# Patient Record
Sex: Male | Born: 1940 | Race: White | Hispanic: No | Marital: Single | Smoking: Never smoker
Health system: Southern US, Community
[De-identification: ages and names within clinical notes are randomized; demographics above are authoritative.]

## PROBLEM LIST (undated history)

## (undated) DIAGNOSIS — E119 Type 2 diabetes mellitus without complications: Secondary | ICD-10-CM

## (undated) DIAGNOSIS — E1140 Type 2 diabetes mellitus with diabetic neuropathy, unspecified: Secondary | ICD-10-CM

## (undated) DIAGNOSIS — Z789 Other specified health status: Secondary | ICD-10-CM

## (undated) DIAGNOSIS — I739 Peripheral vascular disease, unspecified: Secondary | ICD-10-CM

## (undated) DIAGNOSIS — M14672 Charcot's joint, left ankle and foot: Secondary | ICD-10-CM

## (undated) HISTORY — DX: Type 2 diabetes mellitus without complications: E11.9

## (undated) HISTORY — DX: Other specified health status: Z78.9

## (undated) HISTORY — DX: Charcot's joint, left ankle and foot: M14.672

## (undated) HISTORY — DX: Peripheral vascular disease, unspecified: I73.9

## (undated) HISTORY — DX: Type 2 diabetes mellitus with diabetic neuropathy, unspecified: E11.40

---

## 2016-04-01 ENCOUNTER — Encounter: Payer: Self-pay | Admitting: Podiatry

## 2016-04-01 ENCOUNTER — Ambulatory Visit (INDEPENDENT_AMBULATORY_CARE_PROVIDER_SITE_OTHER): Payer: Medicare Other | Admitting: Podiatry

## 2016-04-01 VITALS — BP 88/45 | HR 79 | Resp 14

## 2016-04-01 DIAGNOSIS — L97501 Non-pressure chronic ulcer of other part of unspecified foot limited to breakdown of skin: Secondary | ICD-10-CM

## 2016-04-01 DIAGNOSIS — L89891 Pressure ulcer of other site, stage 1: Secondary | ICD-10-CM | POA: Diagnosis not present

## 2016-04-01 DIAGNOSIS — E1151 Type 2 diabetes mellitus with diabetic peripheral angiopathy without gangrene: Secondary | ICD-10-CM

## 2016-04-01 DIAGNOSIS — M79676 Pain in unspecified toe(s): Secondary | ICD-10-CM | POA: Diagnosis not present

## 2016-04-01 DIAGNOSIS — B351 Tinea unguium: Secondary | ICD-10-CM | POA: Diagnosis not present
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
## 2016-05-13 DIAGNOSIS — E11621 Type 2 diabetes mellitus with foot ulcer: Secondary | ICD-10-CM | POA: Diagnosis not present

## 2016-05-13 DIAGNOSIS — I5030 Unspecified diastolic (congestive) heart failure: Secondary | ICD-10-CM | POA: Diagnosis not present

## 2016-05-13 DIAGNOSIS — L97511 Non-pressure chronic ulcer of other part of right foot limited to breakdown of skin: Secondary | ICD-10-CM | POA: Diagnosis not present

## 2016-05-13 DIAGNOSIS — L97521 Non-pressure chronic ulcer of other part of left foot limited to breakdown of skin: Secondary | ICD-10-CM | POA: Diagnosis not present

## 2016-05-16 DIAGNOSIS — E11621 Type 2 diabetes mellitus with foot ulcer: Secondary | ICD-10-CM | POA: Diagnosis not present

## 2016-05-16 DIAGNOSIS — L97521 Non-pressure chronic ulcer of other part of left foot limited to breakdown of skin: Secondary | ICD-10-CM | POA: Diagnosis not present

## 2016-05-16 DIAGNOSIS — L97511 Non-pressure chronic ulcer of other part of right foot limited to breakdown of skin: Secondary | ICD-10-CM | POA: Diagnosis not present

## 2016-05-16 DIAGNOSIS — L97421 Non-pressure chronic ulcer of left heel and midfoot limited to breakdown of skin: Secondary | ICD-10-CM | POA: Diagnosis not present

## 2016-05-16 DIAGNOSIS — I5030 Unspecified diastolic (congestive) heart failure: Secondary | ICD-10-CM | POA: Diagnosis not present

## 2016-05-23 DIAGNOSIS — L97521 Non-pressure chronic ulcer of other part of left foot limited to breakdown of skin: Secondary | ICD-10-CM | POA: Diagnosis not present

## 2016-05-23 DIAGNOSIS — L97421 Non-pressure chronic ulcer of left heel and midfoot limited to breakdown of skin: Secondary | ICD-10-CM | POA: Diagnosis not present

## 2016-05-23 DIAGNOSIS — E11621 Type 2 diabetes mellitus with foot ulcer: Secondary | ICD-10-CM | POA: Diagnosis not present

## 2016-05-23 DIAGNOSIS — L97512 Non-pressure chronic ulcer of other part of right foot with fat layer exposed: Secondary | ICD-10-CM | POA: Diagnosis not present

## 2016-05-23 DIAGNOSIS — I5030 Unspecified diastolic (congestive) heart failure: Secondary | ICD-10-CM | POA: Diagnosis not present

## 2016-05-30 DIAGNOSIS — E11621 Type 2 diabetes mellitus with foot ulcer: Secondary | ICD-10-CM | POA: Diagnosis not present

## 2016-05-30 DIAGNOSIS — L97512 Non-pressure chronic ulcer of other part of right foot with fat layer exposed: Secondary | ICD-10-CM | POA: Diagnosis not present

## 2016-05-30 DIAGNOSIS — L97521 Non-pressure chronic ulcer of other part of left foot limited to breakdown of skin: Secondary | ICD-10-CM | POA: Diagnosis not present

## 2016-05-30 DIAGNOSIS — I5030 Unspecified diastolic (congestive) heart failure: Secondary | ICD-10-CM | POA: Diagnosis not present

## 2016-06-03 DIAGNOSIS — L97512 Non-pressure chronic ulcer of other part of right foot with fat layer exposed: Secondary | ICD-10-CM | POA: Diagnosis not present

## 2016-06-03 DIAGNOSIS — I5030 Unspecified diastolic (congestive) heart failure: Secondary | ICD-10-CM | POA: Diagnosis not present

## 2016-06-03 DIAGNOSIS — L97521 Non-pressure chronic ulcer of other part of left foot limited to breakdown of skin: Secondary | ICD-10-CM | POA: Diagnosis not present

## 2016-06-03 DIAGNOSIS — E11621 Type 2 diabetes mellitus with foot ulcer: Secondary | ICD-10-CM | POA: Diagnosis not present

## 2016-06-07 DIAGNOSIS — I5030 Unspecified diastolic (congestive) heart failure: Secondary | ICD-10-CM | POA: Diagnosis not present

## 2016-06-07 DIAGNOSIS — L97521 Non-pressure chronic ulcer of other part of left foot limited to breakdown of skin: Secondary | ICD-10-CM | POA: Diagnosis not present

## 2016-06-07 DIAGNOSIS — L97512 Non-pressure chronic ulcer of other part of right foot with fat layer exposed: Secondary | ICD-10-CM | POA: Diagnosis not present

## 2016-06-07 DIAGNOSIS — E11621 Type 2 diabetes mellitus with foot ulcer: Secondary | ICD-10-CM | POA: Diagnosis not present

## 2016-06-12 DIAGNOSIS — L97521 Non-pressure chronic ulcer of other part of left foot limited to breakdown of skin: Secondary | ICD-10-CM | POA: Diagnosis not present

## 2016-06-12 DIAGNOSIS — E11621 Type 2 diabetes mellitus with foot ulcer: Secondary | ICD-10-CM | POA: Diagnosis not present

## 2016-06-19 DIAGNOSIS — L97521 Non-pressure chronic ulcer of other part of left foot limited to breakdown of skin: Secondary | ICD-10-CM | POA: Diagnosis not present

## 2016-06-19 DIAGNOSIS — E11621 Type 2 diabetes mellitus with foot ulcer: Secondary | ICD-10-CM | POA: Diagnosis not present

## 2016-06-19 DIAGNOSIS — L97421 Non-pressure chronic ulcer of left heel and midfoot limited to breakdown of skin: Secondary | ICD-10-CM | POA: Diagnosis not present

## 2016-06-26 DIAGNOSIS — Z09 Encounter for follow-up examination after completed treatment for conditions other than malignant neoplasm: Secondary | ICD-10-CM | POA: Diagnosis not present

## 2016-06-26 DIAGNOSIS — L97429 Non-pressure chronic ulcer of left heel and midfoot with unspecified severity: Secondary | ICD-10-CM | POA: Diagnosis not present

## 2016-06-26 DIAGNOSIS — Z8631 Personal history of diabetic foot ulcer: Secondary | ICD-10-CM | POA: Diagnosis not present

## 2016-06-26 DIAGNOSIS — E11621 Type 2 diabetes mellitus with foot ulcer: Secondary | ICD-10-CM | POA: Diagnosis not present

## 2016-07-10 DIAGNOSIS — E785 Hyperlipidemia, unspecified: Secondary | ICD-10-CM | POA: Diagnosis not present

## 2016-07-10 DIAGNOSIS — I10 Essential (primary) hypertension: Secondary | ICD-10-CM | POA: Diagnosis not present

## 2016-07-10 DIAGNOSIS — M14672 Charcot's joint, left ankle and foot: Secondary | ICD-10-CM | POA: Diagnosis not present

## 2016-07-10 DIAGNOSIS — E1165 Type 2 diabetes mellitus with hyperglycemia: Secondary | ICD-10-CM | POA: Diagnosis not present

## 2016-07-31 DIAGNOSIS — M14672 Charcot's joint, left ankle and foot: Secondary | ICD-10-CM | POA: Diagnosis not present

## 2016-07-31 DIAGNOSIS — R0602 Shortness of breath: Secondary | ICD-10-CM | POA: Diagnosis not present

## 2016-07-31 DIAGNOSIS — Z794 Long term (current) use of insulin: Secondary | ICD-10-CM | POA: Diagnosis not present

## 2016-07-31 DIAGNOSIS — K219 Gastro-esophageal reflux disease without esophagitis: Secondary | ICD-10-CM | POA: Diagnosis not present

## 2016-07-31 DIAGNOSIS — I5023 Acute on chronic systolic (congestive) heart failure: Secondary | ICD-10-CM | POA: Diagnosis not present

## 2016-07-31 DIAGNOSIS — F419 Anxiety disorder, unspecified: Secondary | ICD-10-CM | POA: Diagnosis not present

## 2016-07-31 DIAGNOSIS — R069 Unspecified abnormalities of breathing: Secondary | ICD-10-CM | POA: Diagnosis not present

## 2016-07-31 DIAGNOSIS — Z79899 Other long term (current) drug therapy: Secondary | ICD-10-CM | POA: Diagnosis not present

## 2016-07-31 DIAGNOSIS — E1165 Type 2 diabetes mellitus with hyperglycemia: Secondary | ICD-10-CM | POA: Diagnosis not present

## 2016-07-31 DIAGNOSIS — I472 Ventricular tachycardia: Secondary | ICD-10-CM | POA: Diagnosis not present

## 2016-07-31 DIAGNOSIS — M7989 Other specified soft tissue disorders: Secondary | ICD-10-CM | POA: Diagnosis not present

## 2016-07-31 DIAGNOSIS — R079 Chest pain, unspecified: Secondary | ICD-10-CM | POA: Diagnosis not present

## 2016-07-31 DIAGNOSIS — R0902 Hypoxemia: Secondary | ICD-10-CM | POA: Diagnosis not present

## 2016-07-31 DIAGNOSIS — N400 Enlarged prostate without lower urinary tract symptoms: Secondary | ICD-10-CM | POA: Diagnosis not present

## 2016-07-31 DIAGNOSIS — M14679 Charcot's joint, unspecified ankle and foot: Secondary | ICD-10-CM | POA: Diagnosis not present

## 2016-07-31 DIAGNOSIS — Z888 Allergy status to other drugs, medicaments and biological substances status: Secondary | ICD-10-CM | POA: Diagnosis not present

## 2016-07-31 DIAGNOSIS — E1140 Type 2 diabetes mellitus with diabetic neuropathy, unspecified: Secondary | ICD-10-CM | POA: Diagnosis not present

## 2016-07-31 DIAGNOSIS — Z87891 Personal history of nicotine dependence: Secondary | ICD-10-CM | POA: Diagnosis not present

## 2016-08-01 DIAGNOSIS — R0902 Hypoxemia: Secondary | ICD-10-CM | POA: Diagnosis not present

## 2016-08-01 DIAGNOSIS — M14679 Charcot's joint, unspecified ankle and foot: Secondary | ICD-10-CM | POA: Diagnosis not present

## 2016-08-01 DIAGNOSIS — I5023 Acute on chronic systolic (congestive) heart failure: Secondary | ICD-10-CM | POA: Diagnosis not present

## 2016-08-02 DIAGNOSIS — I5023 Acute on chronic systolic (congestive) heart failure: Secondary | ICD-10-CM | POA: Diagnosis not present

## 2016-08-02 DIAGNOSIS — M14679 Charcot's joint, unspecified ankle and foot: Secondary | ICD-10-CM | POA: Diagnosis not present

## 2016-08-02 DIAGNOSIS — R0902 Hypoxemia: Secondary | ICD-10-CM | POA: Diagnosis not present

## 2016-08-03 DIAGNOSIS — R0902 Hypoxemia: Secondary | ICD-10-CM | POA: Diagnosis not present

## 2016-08-03 DIAGNOSIS — M14679 Charcot's joint, unspecified ankle and foot: Secondary | ICD-10-CM | POA: Diagnosis not present

## 2016-08-03 DIAGNOSIS — I5023 Acute on chronic systolic (congestive) heart failure: Secondary | ICD-10-CM | POA: Diagnosis not present

## 2016-08-06 DIAGNOSIS — Z7982 Long term (current) use of aspirin: Secondary | ICD-10-CM | POA: Diagnosis not present

## 2016-08-06 DIAGNOSIS — I110 Hypertensive heart disease with heart failure: Secondary | ICD-10-CM | POA: Diagnosis not present

## 2016-08-06 DIAGNOSIS — E1165 Type 2 diabetes mellitus with hyperglycemia: Secondary | ICD-10-CM | POA: Diagnosis not present

## 2016-08-06 DIAGNOSIS — G8929 Other chronic pain: Secondary | ICD-10-CM | POA: Diagnosis not present

## 2016-08-06 DIAGNOSIS — Z9181 History of falling: Secondary | ICD-10-CM | POA: Diagnosis not present

## 2016-08-06 DIAGNOSIS — Z87891 Personal history of nicotine dependence: Secondary | ICD-10-CM | POA: Diagnosis not present

## 2016-08-06 DIAGNOSIS — E1140 Type 2 diabetes mellitus with diabetic neuropathy, unspecified: Secondary | ICD-10-CM | POA: Diagnosis not present

## 2016-08-06 DIAGNOSIS — E11610 Type 2 diabetes mellitus with diabetic neuropathic arthropathy: Secondary | ICD-10-CM | POA: Diagnosis not present

## 2016-08-06 DIAGNOSIS — F419 Anxiety disorder, unspecified: Secondary | ICD-10-CM | POA: Diagnosis not present

## 2016-08-06 DIAGNOSIS — I5023 Acute on chronic systolic (congestive) heart failure: Secondary | ICD-10-CM | POA: Diagnosis not present

## 2016-08-07 ENCOUNTER — Encounter: Payer: Self-pay | Admitting: Sports Medicine

## 2016-08-07 ENCOUNTER — Ambulatory Visit (INDEPENDENT_AMBULATORY_CARE_PROVIDER_SITE_OTHER): Payer: PPO | Admitting: Sports Medicine

## 2016-08-07 ENCOUNTER — Encounter (INDEPENDENT_AMBULATORY_CARE_PROVIDER_SITE_OTHER): Payer: Self-pay

## 2016-08-07 ENCOUNTER — Telehealth: Payer: Self-pay | Admitting: *Deleted

## 2016-08-07 DIAGNOSIS — R05 Cough: Secondary | ICD-10-CM | POA: Diagnosis not present

## 2016-08-07 DIAGNOSIS — E1151 Type 2 diabetes mellitus with diabetic peripheral angiopathy without gangrene: Secondary | ICD-10-CM

## 2016-08-07 DIAGNOSIS — M79676 Pain in unspecified toe(s): Secondary | ICD-10-CM

## 2016-08-07 DIAGNOSIS — I517 Cardiomegaly: Secondary | ICD-10-CM | POA: Diagnosis not present

## 2016-08-07 DIAGNOSIS — E1165 Type 2 diabetes mellitus with hyperglycemia: Secondary | ICD-10-CM | POA: Diagnosis not present

## 2016-08-07 DIAGNOSIS — R0989 Other specified symptoms and signs involving the circulatory and respiratory systems: Secondary | ICD-10-CM

## 2016-08-07 DIAGNOSIS — I700 Atherosclerosis of aorta: Secondary | ICD-10-CM | POA: Diagnosis not present

## 2016-08-07 DIAGNOSIS — M79672 Pain in left foot: Secondary | ICD-10-CM | POA: Diagnosis not present

## 2016-08-07 DIAGNOSIS — R0602 Shortness of breath: Secondary | ICD-10-CM | POA: Diagnosis not present

## 2016-08-07 DIAGNOSIS — M14672 Charcot's joint, left ankle and foot: Secondary | ICD-10-CM | POA: Diagnosis not present

## 2016-08-07 DIAGNOSIS — J90 Pleural effusion, not elsewhere classified: Secondary | ICD-10-CM | POA: Diagnosis not present

## 2016-08-07 DIAGNOSIS — T07XXXA Unspecified multiple injuries, initial encounter: Secondary | ICD-10-CM

## 2016-08-07 DIAGNOSIS — I5040 Unspecified combined systolic (congestive) and diastolic (congestive) heart failure: Secondary | ICD-10-CM | POA: Diagnosis not present

## 2016-08-07 DIAGNOSIS — M79671 Pain in right foot: Secondary | ICD-10-CM

## 2016-08-07 DIAGNOSIS — T148 Other injury of unspecified body region: Secondary | ICD-10-CM

## 2016-08-07 DIAGNOSIS — E1140 Type 2 diabetes mellitus with diabetic neuropathy, unspecified: Secondary | ICD-10-CM

## 2016-08-07 DIAGNOSIS — B351 Tinea unguium: Secondary | ICD-10-CM

## 2016-08-07 NOTE — Telephone Encounter (Addendum)
-----   Message from Devin Islamitorya Merritt, [place] sent at 08/07/2016  1:39 PM EDT ----- Regarding: Vascular consults ABIs/PVRs with consult to vascular surgeon Left charcot with multiple abrasions to toes with pain Non palpable pulses DM with angiopathy and neuropathy Dr. Marylene Merritt. I spoke with Devin Merritt and she said it would be fine to have pt [facility].  [facility] lab and referral scheduling.

## 2016-08-07 NOTE — Progress Notes (Signed)
Subjective: Devin Merritt. is a 75 y.o. male patient with history of diabetes who presents to office today complaining of long, painful nails  while in shoes; unable to trim. Patient's niece states that he is a "bad diabetic". Admits history of previous ulceration and pain in toes especially on left foot where he has Charcot and leg deformity. Patient denies any new changes in medication or new problems. Patient denies any new cramping, numbness, burning or tingling in the legs.  Patient is assisted by niece at this visit.   There are no active problems to display for this patient.  Current Outpatient Prescriptions on File Prior to Visit  Medication Sig Dispense Refill  . acetaminophen (TYLENOL) 325 MG tablet Take 650 mg by mouth every 6 (six) hours as needed.    Marland Kitchen aspirin 81 MG tablet Take 81 mg by mouth daily.    Marland Kitchen atorvastatin (LIPITOR) 10 MG tablet Take 10 mg by mouth daily.    . carvedilol (COREG) 12.5 MG tablet Take 12.5 mg by mouth 2 (two) times daily with a meal.    . fluticasone (FLONASE) 50 MCG/ACT nasal spray Place 2 sprays into both nostrils daily.    . furosemide (LASIX) 20 MG tablet Take 20 mg by mouth daily.    Marland Kitchen HYDROcodone-acetaminophen (NORCO) 5-325 MG tablet Take 1 tablet by mouth every 6 (six) hours as needed for moderate pain.    . Insulin Glargine (LANTUS SOLOSTAR) 100 UNIT/ML Solostar Pen Inject into the skin daily at 10 pm. Inject 5units subcutaneously daily at bedtime    . levofloxacin (LEVAQUIN) 750 MG tablet Take 750 mg by mouth daily.    Marland Kitchen lisinopril (PRINIVIL,ZESTRIL) 2.5 MG tablet Take 2.5 mg by mouth daily.    Marland Kitchen omeprazole (PRILOSEC) 20 MG capsule Take 20 mg by mouth daily.    . ondansetron (ZOFRAN) 4 MG tablet Take 4 mg by mouth every 8 (eight) hours as needed for nausea or vomiting.    . polyethylene glycol powder (MIRALAX) powder Take 1 Container by mouth daily.    . Prenatal Vit-Fe Fumarate-FA (PRENATAL VITAMIN PO) Take by mouth daily.    Marland Kitchen senna  (SENOKOT) 8.6 MG tablet Take 1 tablet by mouth 2 (two) times daily.    . simvastatin (ZOCOR) 20 MG tablet Take 20 mg by mouth daily.    Marland Kitchen spironolactone (ALDACTONE) 25 MG tablet Take 25 mg by mouth 2 (two) times daily.    . tamsulosin (FLOMAX) 0.4 MG CAPS capsule Take 0.4 mg by mouth daily.    Marland Kitchen thiamine (VITAMIN B-1) 100 MG tablet Take 100 mg by mouth daily.     No current facility-administered medications on file prior to visit.    Allergies  Allergen Reactions  . Sulfa Antibiotics Other (See Comments)    No results found for this or any previous visit (from the past 2160 hour(s)).  Objective: General: Patient is awake, alert, and oriented x 3 and in no acute distress in wheelchair.  Integument: Skin is warm, dry and supple bilateral. Nails are tender, long, thickened and dystrophic with subungual debris, consistent with onychomycosis, 1-5 bilateral. Multiple superficial abrasions to toes and dry blood blisters with No signs of infection. No open lesions. Remaining integument unremarkable.  Vasculature:  Dorsalis Pedis pulse 0/4 bilateral. Posterior Tibial pulse  0/4 bilateral. Capillary fill time <5 sec 1-5 bilateral. No hair growth to the level of the digits.Temperature gradient decreased. Mild varicosities present bilateral. Trace edema present bilateral. Shiny taut appearance to skin on  left foot with dependent rubor.   Neurology: The patient has absent sensation measured with a 5.07/10g Semmes Weinstein Monofilament at all pedal sites bilateral . Vibratory sensation absent bilateral with tuning fork. No Babinski sign present bilateral.   Musculoskeletal: Left fixed equniovarus charcot ankle on left, Muscular strength 4/5 in all lower extremity muscular groups bilateral without pain on range of motion . No tenderness with calf compression bilateral.  Assessment and Plan: Problem List Items Addressed This Visit    None    Visit Diagnoses    Diabetes mellitus with peripheral  angiopathy (HCC)    -  Primary   Pain due to onychomycosis of toenail       Charcot ankle, left       Foot pain, bilateral       Abrasions of multiple sites       Type 2 diabetes mellitus with diabetic neuropathy, unspecified long term insulin use status (HCC)          -Examined patient. -Discussed and educated patient on diabetic foot care, especially with  regards to the vascular, neurological and musculoskeletal systems.  -Stressed the importance of good glycemic control and the detriment of not controlling glucose levels in relation to the foot. -Mechanically debrided all nails 1-5 bilateral using sterile nail nipper and filed with dremel without incident  -Vascular consult ordered -Answered all patient questions -Patient to return  in 3 months for at risk foot care. Meanwhile will call patient with results of vascular tests are concerning to make sure vascular surgery follow-up is in place -Patient advised to call the office if any problems or questions arise in the meantime.  Asencion Islamitorya Maham Quintin, DPM

## 2016-08-14 DIAGNOSIS — R0602 Shortness of breath: Secondary | ICD-10-CM | POA: Diagnosis not present

## 2016-08-14 DIAGNOSIS — R799 Abnormal finding of blood chemistry, unspecified: Secondary | ICD-10-CM | POA: Diagnosis not present

## 2016-08-14 DIAGNOSIS — E1165 Type 2 diabetes mellitus with hyperglycemia: Secondary | ICD-10-CM | POA: Diagnosis not present

## 2016-08-19 ENCOUNTER — Telehealth: Payer: Self-pay | Admitting: Cardiovascular Disease

## 2016-08-19 NOTE — Telephone Encounter (Signed)
Received records from [facility] for appointment on 08/21/16 with Dr Allyson SabalBerry.  [facility] for Dr Hazle CocaBerry's schedule on 08/21/16. lp

## 2016-08-21 ENCOUNTER — Ambulatory Visit: Payer: Self-pay | Admitting: Cardiovascular Disease

## 2016-08-21 DIAGNOSIS — R918 Other nonspecific abnormal finding of lung field: Secondary | ICD-10-CM | POA: Diagnosis not present

## 2016-08-21 DIAGNOSIS — J439 Emphysema, unspecified: Secondary | ICD-10-CM | POA: Diagnosis not present

## 2016-09-18 DIAGNOSIS — E1165 Type 2 diabetes mellitus with hyperglycemia: Secondary | ICD-10-CM | POA: Diagnosis not present

## 2016-09-18 DIAGNOSIS — I5023 Acute on chronic systolic (congestive) heart failure: Secondary | ICD-10-CM | POA: Diagnosis not present

## 2016-11-06 ENCOUNTER — Ambulatory Visit: Payer: Self-pay | Admitting: Sports Medicine

## 2017-01-23 DIAGNOSIS — R079 Chest pain, unspecified: Secondary | ICD-10-CM | POA: Diagnosis not present

## 2017-01-23 DIAGNOSIS — K5900 Constipation, unspecified: Secondary | ICD-10-CM | POA: Diagnosis not present

## 2017-01-23 DIAGNOSIS — R109 Unspecified abdominal pain: Secondary | ICD-10-CM | POA: Diagnosis not present

## 2017-03-25 DIAGNOSIS — E1165 Type 2 diabetes mellitus with hyperglycemia: Secondary | ICD-10-CM | POA: Diagnosis not present

## 2017-03-25 DIAGNOSIS — M14672 Charcot's joint, left ankle and foot: Secondary | ICD-10-CM | POA: Diagnosis not present

## 2017-03-25 DIAGNOSIS — E785 Hyperlipidemia, unspecified: Secondary | ICD-10-CM | POA: Diagnosis not present

## 2017-03-25 DIAGNOSIS — I10 Essential (primary) hypertension: Secondary | ICD-10-CM | POA: Diagnosis not present

## 2017-03-25 DIAGNOSIS — Z9119 Patient's noncompliance with other medical treatment and regimen: Secondary | ICD-10-CM | POA: Diagnosis not present

## 2017-03-26 DIAGNOSIS — I509 Heart failure, unspecified: Secondary | ICD-10-CM | POA: Diagnosis not present

## 2017-03-26 DIAGNOSIS — E1169 Type 2 diabetes mellitus with other specified complication: Secondary | ICD-10-CM | POA: Diagnosis not present

## 2017-03-26 DIAGNOSIS — R531 Weakness: Secondary | ICD-10-CM | POA: Diagnosis not present

## 2017-03-26 DIAGNOSIS — Z794 Long term (current) use of insulin: Secondary | ICD-10-CM | POA: Diagnosis not present

## 2017-03-26 DIAGNOSIS — E860 Dehydration: Secondary | ICD-10-CM | POA: Diagnosis not present

## 2017-05-13 DIAGNOSIS — E1165 Type 2 diabetes mellitus with hyperglycemia: Secondary | ICD-10-CM | POA: Diagnosis not present

## 2017-06-20 DIAGNOSIS — R531 Weakness: Secondary | ICD-10-CM | POA: Diagnosis not present

## 2017-06-20 DIAGNOSIS — J9811 Atelectasis: Secondary | ICD-10-CM | POA: Diagnosis not present

## 2017-06-20 DIAGNOSIS — I517 Cardiomegaly: Secondary | ICD-10-CM | POA: Diagnosis not present

## 2017-06-20 DIAGNOSIS — G839 Paralytic syndrome, unspecified: Secondary | ICD-10-CM | POA: Diagnosis not present

## 2017-06-20 DIAGNOSIS — I6789 Other cerebrovascular disease: Secondary | ICD-10-CM | POA: Diagnosis not present

## 2017-06-25 DIAGNOSIS — Z9114 Patient's other noncompliance with medication regimen: Secondary | ICD-10-CM | POA: Diagnosis not present

## 2017-06-25 DIAGNOSIS — E1165 Type 2 diabetes mellitus with hyperglycemia: Secondary | ICD-10-CM | POA: Diagnosis not present

## 2017-06-25 DIAGNOSIS — Z9111 Patient's noncompliance with dietary regimen: Secondary | ICD-10-CM | POA: Diagnosis not present

## 2017-06-25 DIAGNOSIS — R4182 Altered mental status, unspecified: Secondary | ICD-10-CM | POA: Diagnosis not present

## 2017-06-25 DIAGNOSIS — I10 Essential (primary) hypertension: Secondary | ICD-10-CM | POA: Diagnosis not present

## 2017-06-26 DIAGNOSIS — R740 Nonspecific elevation of levels of transaminase and lactic acid dehydrogenase [LDH]: Secondary | ICD-10-CM | POA: Diagnosis not present

## 2017-06-26 DIAGNOSIS — N400 Enlarged prostate without lower urinary tract symptoms: Secondary | ICD-10-CM | POA: Diagnosis not present

## 2017-06-26 DIAGNOSIS — I5023 Acute on chronic systolic (congestive) heart failure: Secondary | ICD-10-CM | POA: Diagnosis not present

## 2017-06-26 DIAGNOSIS — E11610 Type 2 diabetes mellitus with diabetic neuropathic arthropathy: Secondary | ICD-10-CM | POA: Diagnosis not present

## 2017-06-26 DIAGNOSIS — Z794 Long term (current) use of insulin: Secondary | ICD-10-CM | POA: Diagnosis not present

## 2017-06-26 DIAGNOSIS — E119 Type 2 diabetes mellitus without complications: Secondary | ICD-10-CM | POA: Diagnosis not present

## 2017-06-26 DIAGNOSIS — Z87891 Personal history of nicotine dependence: Secondary | ICD-10-CM | POA: Diagnosis not present

## 2017-06-26 DIAGNOSIS — L89301 Pressure ulcer of unspecified buttock, stage 1: Secondary | ICD-10-CM | POA: Diagnosis not present

## 2017-06-26 DIAGNOSIS — E1165 Type 2 diabetes mellitus with hyperglycemia: Secondary | ICD-10-CM | POA: Diagnosis not present

## 2017-06-26 DIAGNOSIS — R799 Abnormal finding of blood chemistry, unspecified: Secondary | ICD-10-CM | POA: Diagnosis not present

## 2017-06-26 DIAGNOSIS — R4182 Altered mental status, unspecified: Secondary | ICD-10-CM | POA: Diagnosis not present

## 2017-06-26 DIAGNOSIS — J90 Pleural effusion, not elsewhere classified: Secondary | ICD-10-CM | POA: Diagnosis not present

## 2017-06-26 DIAGNOSIS — K829 Disease of gallbladder, unspecified: Secondary | ICD-10-CM | POA: Diagnosis not present

## 2017-06-26 DIAGNOSIS — L8992 Pressure ulcer of unspecified site, stage 2: Secondary | ICD-10-CM | POA: Diagnosis not present

## 2017-06-26 DIAGNOSIS — E876 Hypokalemia: Secondary | ICD-10-CM | POA: Diagnosis not present

## 2017-06-26 DIAGNOSIS — M14679 Charcot's joint, unspecified ankle and foot: Secondary | ICD-10-CM | POA: Diagnosis not present

## 2017-06-26 DIAGNOSIS — Z7984 Long term (current) use of oral hypoglycemic drugs: Secondary | ICD-10-CM | POA: Diagnosis not present

## 2017-06-26 DIAGNOSIS — Z79899 Other long term (current) drug therapy: Secondary | ICD-10-CM | POA: Diagnosis not present

## 2017-06-26 DIAGNOSIS — D649 Anemia, unspecified: Secondary | ICD-10-CM | POA: Diagnosis not present

## 2017-06-26 DIAGNOSIS — Z9114 Patient's other noncompliance with medication regimen: Secondary | ICD-10-CM | POA: Diagnosis not present

## 2017-06-26 DIAGNOSIS — R509 Fever, unspecified: Secondary | ICD-10-CM | POA: Diagnosis not present

## 2017-06-26 DIAGNOSIS — I472 Ventricular tachycardia: Secondary | ICD-10-CM | POA: Diagnosis not present

## 2017-06-26 DIAGNOSIS — L8990 Pressure ulcer of unspecified site, unspecified stage: Secondary | ICD-10-CM | POA: Diagnosis not present

## 2017-06-26 DIAGNOSIS — K219 Gastro-esophageal reflux disease without esophagitis: Secondary | ICD-10-CM | POA: Diagnosis not present

## 2017-06-26 DIAGNOSIS — Z993 Dependence on wheelchair: Secondary | ICD-10-CM | POA: Diagnosis not present

## 2017-06-26 DIAGNOSIS — K761 Chronic passive congestion of liver: Secondary | ICD-10-CM | POA: Diagnosis not present

## 2017-06-26 DIAGNOSIS — M14672 Charcot's joint, left ankle and foot: Secondary | ICD-10-CM | POA: Diagnosis not present

## 2017-06-26 DIAGNOSIS — R109 Unspecified abdominal pain: Secondary | ICD-10-CM | POA: Diagnosis not present

## 2017-06-26 DIAGNOSIS — E871 Hypo-osmolality and hyponatremia: Secondary | ICD-10-CM | POA: Diagnosis not present

## 2017-06-26 DIAGNOSIS — I5041 Acute combined systolic (congestive) and diastolic (congestive) heart failure: Secondary | ICD-10-CM | POA: Diagnosis not present

## 2017-06-27 DIAGNOSIS — R509 Fever, unspecified: Secondary | ICD-10-CM | POA: Diagnosis not present

## 2017-06-27 DIAGNOSIS — R740 Nonspecific elevation of levels of transaminase and lactic acid dehydrogenase [LDH]: Secondary | ICD-10-CM | POA: Diagnosis not present

## 2017-06-27 DIAGNOSIS — E119 Type 2 diabetes mellitus without complications: Secondary | ICD-10-CM | POA: Diagnosis not present

## 2017-06-28 DIAGNOSIS — I5041 Acute combined systolic (congestive) and diastolic (congestive) heart failure: Secondary | ICD-10-CM | POA: Diagnosis not present

## 2017-06-28 DIAGNOSIS — I472 Ventricular tachycardia: Secondary | ICD-10-CM | POA: Diagnosis not present

## 2017-06-30 ENCOUNTER — Inpatient Hospital Stay (HOSPITAL_COMMUNITY)
Admission: EM | Admit: 2017-06-30 | Discharge: 2017-07-08 | DRG: 287 | Disposition: A | Discharge status: Hospice | Payer: PPO | Attending: Internal Medicine | Admitting: Internal Medicine

## 2017-06-30 ENCOUNTER — Emergency Department (HOSPITAL_COMMUNITY): Payer: PPO

## 2017-06-30 ENCOUNTER — Encounter (HOSPITAL_COMMUNITY): Payer: Self-pay | Admitting: *Deleted

## 2017-06-30 DIAGNOSIS — F0390 Unspecified dementia without behavioral disturbance: Secondary | ICD-10-CM | POA: Diagnosis present

## 2017-06-30 DIAGNOSIS — T17800A Unspecified foreign body in other parts of respiratory tract causing asphyxiation, initial encounter: Secondary | ICD-10-CM

## 2017-06-30 DIAGNOSIS — I360 Nonrheumatic tricuspid (valve) stenosis: Secondary | ICD-10-CM | POA: Diagnosis not present

## 2017-06-30 DIAGNOSIS — I5023 Acute on chronic systolic (congestive) heart failure: Secondary | ICD-10-CM | POA: Diagnosis not present

## 2017-06-30 DIAGNOSIS — E119 Type 2 diabetes mellitus without complications: Secondary | ICD-10-CM

## 2017-06-30 DIAGNOSIS — R05 Cough: Secondary | ICD-10-CM | POA: Diagnosis not present

## 2017-06-30 DIAGNOSIS — I272 Pulmonary hypertension, unspecified: Secondary | ICD-10-CM | POA: Diagnosis present

## 2017-06-30 DIAGNOSIS — I5041 Acute combined systolic (congestive) and diastolic (congestive) heart failure: Secondary | ICD-10-CM

## 2017-06-30 DIAGNOSIS — I509 Heart failure, unspecified: Secondary | ICD-10-CM | POA: Diagnosis not present

## 2017-06-30 DIAGNOSIS — Z87891 Personal history of nicotine dependence: Secondary | ICD-10-CM

## 2017-06-30 DIAGNOSIS — I5021 Acute systolic (congestive) heart failure: Secondary | ICD-10-CM

## 2017-06-30 DIAGNOSIS — N179 Acute kidney failure, unspecified: Secondary | ICD-10-CM | POA: Diagnosis present

## 2017-06-30 DIAGNOSIS — K5900 Constipation, unspecified: Secondary | ICD-10-CM | POA: Diagnosis present

## 2017-06-30 DIAGNOSIS — T17800S Unspecified foreign body in other parts of respiratory tract causing asphyxiation, sequela: Secondary | ICD-10-CM | POA: Diagnosis not present

## 2017-06-30 DIAGNOSIS — E871 Hypo-osmolality and hyponatremia: Secondary | ICD-10-CM

## 2017-06-30 DIAGNOSIS — T17908A Unspecified foreign body in respiratory tract, part unspecified causing other injury, initial encounter: Secondary | ICD-10-CM | POA: Diagnosis not present

## 2017-06-30 DIAGNOSIS — Z882 Allergy status to sulfonamides status: Secondary | ICD-10-CM | POA: Diagnosis not present

## 2017-06-30 DIAGNOSIS — F0150 Vascular dementia without behavioral disturbance: Secondary | ICD-10-CM | POA: Diagnosis not present

## 2017-06-30 DIAGNOSIS — Z515 Encounter for palliative care: Secondary | ICD-10-CM

## 2017-06-30 DIAGNOSIS — Z66 Do not resuscitate: Secondary | ICD-10-CM | POA: Diagnosis not present

## 2017-06-30 DIAGNOSIS — Z794 Long term (current) use of insulin: Secondary | ICD-10-CM | POA: Diagnosis not present

## 2017-06-30 DIAGNOSIS — E873 Alkalosis: Secondary | ICD-10-CM | POA: Diagnosis present

## 2017-06-30 DIAGNOSIS — E11610 Type 2 diabetes mellitus with diabetic neuropathic arthropathy: Secondary | ICD-10-CM | POA: Diagnosis not present

## 2017-06-30 DIAGNOSIS — E1165 Type 2 diabetes mellitus with hyperglycemia: Secondary | ICD-10-CM | POA: Diagnosis present

## 2017-06-30 DIAGNOSIS — E876 Hypokalemia: Secondary | ICD-10-CM | POA: Diagnosis not present

## 2017-06-30 DIAGNOSIS — I5084 End stage heart failure: Secondary | ICD-10-CM | POA: Diagnosis not present

## 2017-06-30 DIAGNOSIS — L89309 Pressure ulcer of unspecified buttock, unspecified stage: Secondary | ICD-10-CM | POA: Diagnosis not present

## 2017-06-30 DIAGNOSIS — L89152 Pressure ulcer of sacral region, stage 2: Secondary | ICD-10-CM | POA: Diagnosis present

## 2017-06-30 DIAGNOSIS — L89159 Pressure ulcer of sacral region, unspecified stage: Secondary | ICD-10-CM | POA: Diagnosis not present

## 2017-06-30 DIAGNOSIS — I5042 Chronic combined systolic (congestive) and diastolic (congestive) heart failure: Secondary | ICD-10-CM

## 2017-06-30 DIAGNOSIS — J90 Pleural effusion, not elsewhere classified: Secondary | ICD-10-CM

## 2017-06-30 DIAGNOSIS — E1140 Type 2 diabetes mellitus with diabetic neuropathy, unspecified: Secondary | ICD-10-CM | POA: Diagnosis present

## 2017-06-30 DIAGNOSIS — Z79899 Other long term (current) drug therapy: Secondary | ICD-10-CM

## 2017-06-30 DIAGNOSIS — R7401 Elevation of levels of liver transaminase levels: Secondary | ICD-10-CM

## 2017-06-30 DIAGNOSIS — R0602 Shortness of breath: Secondary | ICD-10-CM | POA: Diagnosis not present

## 2017-06-30 DIAGNOSIS — M14672 Charcot's joint, left ankle and foot: Secondary | ICD-10-CM | POA: Diagnosis not present

## 2017-06-30 DIAGNOSIS — R627 Adult failure to thrive: Secondary | ICD-10-CM

## 2017-06-30 DIAGNOSIS — Z7982 Long term (current) use of aspirin: Secondary | ICD-10-CM | POA: Diagnosis not present

## 2017-06-30 DIAGNOSIS — L8990 Pressure ulcer of unspecified site, unspecified stage: Secondary | ICD-10-CM

## 2017-06-30 DIAGNOSIS — I5043 Acute on chronic combined systolic (congestive) and diastolic (congestive) heart failure: Secondary | ICD-10-CM | POA: Diagnosis not present

## 2017-06-30 DIAGNOSIS — I25110 Atherosclerotic heart disease of native coronary artery with unstable angina pectoris: Secondary | ICD-10-CM | POA: Diagnosis not present

## 2017-06-30 DIAGNOSIS — R0989 Other specified symptoms and signs involving the circulatory and respiratory systems: Secondary | ICD-10-CM | POA: Diagnosis not present

## 2017-06-30 DIAGNOSIS — I951 Orthostatic hypotension: Secondary | ICD-10-CM

## 2017-06-30 DIAGNOSIS — I2510 Atherosclerotic heart disease of native coronary artery without angina pectoris: Secondary | ICD-10-CM | POA: Diagnosis not present

## 2017-06-30 DIAGNOSIS — I420 Dilated cardiomyopathy: Secondary | ICD-10-CM | POA: Diagnosis not present

## 2017-06-30 DIAGNOSIS — IMO0002 Reserved for concepts with insufficient information to code with codable children: Secondary | ICD-10-CM

## 2017-06-30 DIAGNOSIS — E118 Type 2 diabetes mellitus with unspecified complications: Secondary | ICD-10-CM | POA: Diagnosis not present

## 2017-06-30 DIAGNOSIS — R079 Chest pain, unspecified: Secondary | ICD-10-CM | POA: Diagnosis not present

## 2017-06-30 DIAGNOSIS — L98421 Non-pressure chronic ulcer of back limited to breakdown of skin: Secondary | ICD-10-CM

## 2017-06-30 DIAGNOSIS — R740 Nonspecific elevation of levels of transaminase and lactic acid dehydrogenase [LDH]: Secondary | ICD-10-CM | POA: Diagnosis present

## 2017-06-30 LAB — CBC
HCT: 36 % — ABNORMAL LOW (ref 39.0–52.0)
Hemoglobin: 11.5 g/dL — ABNORMAL LOW (ref 13.0–17.0)
MCH: 27.8 pg (ref 26.0–34.0)
MCHC: 31.9 g/dL (ref 30.0–36.0)
MCV: 87 fL (ref 78.0–100.0)
PLATELETS: 199 10*3/uL (ref 150–400)
RBC: 4.14 MIL/uL — ABNORMAL LOW (ref 4.22–5.81)
RDW: 16 % — AB (ref 11.5–15.5)
WBC: 7.1 10*3/uL (ref 4.0–10.5)

## 2017-06-30 LAB — I-STAT TROPONIN, ED: Troponin i, poc: 0.02 ng/mL (ref 0.00–0.08)

## 2017-06-30 LAB — GLUCOSE, CAPILLARY: Glucose-Capillary: 193 mg/dL — ABNORMAL HIGH (ref 65–99)

## 2017-06-30 LAB — HEPATIC FUNCTION PANEL
ALBUMIN: 2.9 g/dL — AB (ref 3.5–5.0)
ALK PHOS: 93 U/L (ref 38–126)
ALT: 119 U/L — AB (ref 17–63)
AST: 20 U/L (ref 15–41)
Bilirubin, Direct: 0.4 mg/dL (ref 0.1–0.5)
Indirect Bilirubin: 0.8 mg/dL (ref 0.3–0.9)
TOTAL PROTEIN: 6.2 g/dL — AB (ref 6.5–8.1)
Total Bilirubin: 1.2 mg/dL (ref 0.3–1.2)

## 2017-06-30 LAB — TROPONIN I: Troponin I: 0.04 ng/mL (ref <0.03)

## 2017-06-30 LAB — CREATININE, SERUM
Creatinine, Ser: 1.26 mg/dL — ABNORMAL HIGH (ref 0.61–1.24)
GFR calc Af Amer: >60 mL/min (ref >60)
GFR calc non Af Amer: 54 mL/min — ABNORMAL LOW (ref >60)

## 2017-06-30 LAB — BASIC METABOLIC PANEL
Anion gap: 10 (ref 5–15)
BUN: 27 mg/dL — AB (ref 6–20)
CALCIUM: 8.4 mg/dL — AB (ref 8.9–10.3)
CO2: 25 mmol/L (ref 22–32)
CREATININE: 1.23 mg/dL (ref 0.61–1.24)
Chloride: 93 mmol/L — ABNORMAL LOW (ref 101–111)
GFR calc Af Amer: >60 mL/min (ref >60)
GFR, EST NON AFRICAN AMERICAN: 56 mL/min — AB (ref >60)
GLUCOSE: 189 mg/dL — AB (ref 65–99)
Potassium: 4.4 mmol/L (ref 3.5–5.1)
SODIUM: 128 mmol/L — AB (ref 135–145)

## 2017-06-30 LAB — LACTIC ACID, PLASMA: Lactic Acid, Venous: 2.3 mmol/L (ref 0.5–1.9)

## 2017-06-30 LAB — BRAIN NATRIURETIC PEPTIDE: B NATRIURETIC PEPTIDE 5: 2137.1 pg/mL — AB (ref 0.0–100.0)

## 2017-06-30 MED ORDER — ACETAMINOPHEN 325 MG PO TABS
650.0000 mg | ORAL_TABLET | ORAL | Status: DC | PRN
  Administered 2017-07-01 – 2017-07-06 (×7): 650 mg via ORAL
  Filled 2017-06-30 (×9): qty 2

## 2017-06-30 MED ORDER — ATORVASTATIN CALCIUM 10 MG PO TABS: 10.0000 mg | ORAL_TABLET | Freq: Every day | ORAL | Status: DC

## 2017-06-30 MED ORDER — PANTOPRAZOLE SODIUM 40 MG PO TBEC
40.0000 mg | DELAYED_RELEASE_TABLET | Freq: Every day | ORAL | Status: DC
  Administered 2017-07-01 – 2017-07-07 (×7): 40 mg via ORAL
  Filled 2017-06-30 (×7): qty 1

## 2017-06-30 MED ORDER — INSULIN ASPART 100 UNIT/ML ~~LOC~~ SOLN
0.0000 [IU] | Freq: Three times a day (TID) | SUBCUTANEOUS | Status: DC
  Administered 2017-07-01: 2 [IU] via SUBCUTANEOUS
  Administered 2017-07-01 – 2017-07-02 (×2): 3 [IU] via SUBCUTANEOUS
  Administered 2017-07-03 (×3): 2 [IU] via SUBCUTANEOUS
  Administered 2017-07-04: 1 [IU] via SUBCUTANEOUS
  Administered 2017-07-04: 5 [IU] via SUBCUTANEOUS
  Administered 2017-07-05: 9 [IU] via SUBCUTANEOUS
  Administered 2017-07-05 (×2): 1 [IU] via SUBCUTANEOUS
  Administered 2017-07-06: 5 [IU] via SUBCUTANEOUS
  Administered 2017-07-06: 3 [IU] via SUBCUTANEOUS

## 2017-06-30 MED ORDER — VITAMIN B-1 100 MG PO TABS: 100.0000 mg | ORAL_TABLET | Freq: Every day | ORAL | Status: DC

## 2017-06-30 MED ORDER — INSULIN GLARGINE 100 UNIT/ML SOLOSTAR PEN: 5.0000 [IU] | PEN_INJECTOR | Freq: Every day | SUBCUTANEOUS | Status: DC

## 2017-06-30 MED ORDER — FLUTICASONE PROPIONATE 50 MCG/ACT NA SUSP: 2.0000 | Freq: Every day | NASAL | Status: DC

## 2017-06-30 MED ORDER — SODIUM CHLORIDE 0.9% FLUSH
3.0000 mL | Freq: Two times a day (BID) | INTRAVENOUS | Status: DC
  Administered 2017-06-30 – 2017-07-02 (×4): 3 mL via INTRAVENOUS

## 2017-06-30 MED ORDER — TAMSULOSIN HCL 0.4 MG PO CAPS
0.4000 mg | ORAL_CAPSULE | Freq: Every day | ORAL | Status: DC
  Administered 2017-07-01 – 2017-07-08 (×8): 0.4 mg via ORAL
  Filled 2017-06-30 (×8): qty 1

## 2017-06-30 MED ORDER — FUROSEMIDE 10 MG/ML IJ SOLN
60.0000 mg | Freq: Once | INTRAMUSCULAR | Status: AC
  Administered 2017-06-30: 60 mg via INTRAVENOUS
  Filled 2017-06-30: qty 6

## 2017-06-30 MED ORDER — FUROSEMIDE 10 MG/ML IJ SOLN
20.0000 mg | Freq: Two times a day (BID) | INTRAMUSCULAR | Status: DC
  Administered 2017-07-01: 20 mg via INTRAVENOUS
  Filled 2017-06-30: qty 2

## 2017-06-30 MED ORDER — INSULIN ASPART 100 UNIT/ML ~~LOC~~ SOLN: 0.0000 [IU] | Freq: Every day | SUBCUTANEOUS | Status: DC

## 2017-06-30 MED ORDER — ASPIRIN EC 81 MG PO TBEC
81.0000 mg | DELAYED_RELEASE_TABLET | Freq: Every day | ORAL | Status: DC
  Administered 2017-07-01 – 2017-07-07 (×7): 81 mg via ORAL
  Filled 2017-06-30 (×7): qty 1

## 2017-06-30 MED ORDER — SODIUM CHLORIDE 0.9 % IV SOLN: 250.0000 mL | INTRAVENOUS | Status: DC | PRN

## 2017-06-30 MED ORDER — ENOXAPARIN SODIUM 40 MG/0.4ML ~~LOC~~ SOLN
40.0000 mg | SUBCUTANEOUS | Status: DC
  Administered 2017-06-30 – 2017-07-03 (×4): 40 mg via SUBCUTANEOUS
  Filled 2017-06-30 (×4): qty 0.4

## 2017-06-30 MED ORDER — ONDANSETRON HCL 4 MG/2ML IJ SOLN
4.0000 mg | Freq: Four times a day (QID) | INTRAMUSCULAR | Status: DC | PRN
  Administered 2017-07-07: 4 mg via INTRAVENOUS
  Filled 2017-06-30: qty 2

## 2017-06-30 MED ORDER — SODIUM CHLORIDE 0.9% FLUSH: 3.0000 mL | INTRAVENOUS | Status: DC | PRN

## 2017-06-30 MED ORDER — LISINOPRIL 2.5 MG PO TABS
2.5000 mg | ORAL_TABLET | Freq: Every day | ORAL | Status: DC
  Filled 2017-06-30: qty 1

## 2017-06-30 MED ORDER — CARVEDILOL 12.5 MG PO TABS
12.5000 mg | ORAL_TABLET | Freq: Two times a day (BID) | ORAL | Status: DC
  Administered 2017-07-01: 12.5 mg via ORAL
  Filled 2017-06-30: qty 1

## 2017-06-30 MED ORDER — INSULIN GLARGINE 100 UNIT/ML ~~LOC~~ SOLN
5.0000 [IU] | Freq: Every day | SUBCUTANEOUS | Status: DC
  Administered 2017-06-30 – 2017-07-02 (×3): 5 [IU] via SUBCUTANEOUS
  Filled 2017-06-30 (×4): qty 0.05

## 2017-06-30 NOTE — Progress Notes (Signed)
Troponin is elevated at 0.04, pt has no chest pain at this time. Md notified. Colleen Canesar Johnnie Goynes, RN

## 2017-06-30 NOTE — H&P (Signed)
Primary Physician: Primary Cardiologist:  New    Asked by Dr Tiburcio Pea to see for CP  HPI: Pt is a 47 with history of CHF (had CT scan  Told pumpingf "25")  , DM   Per patients family he has had SOB and chest tightness intermittenetly  Seen by [facility] cardiology last summer   Nothing done Over the past few wks has had increased SOB and chest tightness (intermitt tightness)  Admitted to [facility] hosp for 1 1/2 days  "Nothing done"  Had to be almost carried out to car.  COntinued shortness of breath since d/c  Came to ED  today complaining of palpitations, CP and SOB .          Past Medical History:  Diagnosis Date  . Charcot's joint of left ankle   . Diabetes mellitus without complication (HCC)   . Diabetic neuropathy (HCC)   . Nonpalpable pulse   . Peripheral angiopathy (HCC)      (Not in a hospital admission)   . furosemide  60 mg Intravenous Once    Infusions:   Allergies  Allergen Reactions  . Sulfa Antibiotics Other (See Comments)    Social History   Social History  . Marital status: Single    Spouse name: N/A  . Number of children: N/A  . Years of education: N/A   Occupational History  . Not on file.   Social History Main Topics  . Smoking status: Never Smoker  . Smokeless tobacco: Never Used  . Alcohol use No  . Drug use: No  . Sexual activity: Not on file   Other Topics Concern  . Not on file   Social History Narrative  . No narrative on file    History reviewed. No pertinent family history.  REVIEW OF SYSTEMS:  All systems reviewed  Negative to the above problem except as noted above.    PHYSICAL EXAM: Vitals:   06/30/17 1630 06/30/17 1700  BP: 102/73 105/80  Pulse: 79 82  Resp: (!) 26 (!) 24  Temp:      No intake or output data in the 24 hours ending 06/30/17 1711  General:  Frail appearing 76 yo . No respiratory difficulty HEENT: normal Neck: supple. Mild JVD. Carotids 2+ bilat; no bruits. No lymphadenopathy or thryomegaly  appreciated. Cor: PMI nondisplaced. Regular rate & rhythm. No rubs, gallops or murmurs. Lungs: Rales at bases   Abdomen: soft, nontender, nondistended. No hepatosplenomegaly. No bruits or masses. Good bowel sounds. Extremities: 1+ edema  Wearing support hose  Sore on L ankle   Neuro: alert & oriented x 3, cranial nerves grossly intact. moves all 4 extremities   Otherwise deferred     ECG:  SR 78 bpm  Low voltage, possible anterior MI    Results for orders placed or performed during the hospital encounter of 06/30/17 (from the past 24 hour(s))  Basic metabolic panel     Status: Abnormal   Collection Time: 06/30/17  1:15 PM  Result Value Ref Range   Sodium 128 (L) 135 - 145 mmol/L   Potassium 4.4 3.5 - 5.1 mmol/L   Chloride 93 (L) 101 - 111 mmol/L   CO2 25 22 - 32 mmol/L   Glucose, Bld 189 (H) 65 - 99 mg/dL   BUN 27 (H) 6 - 20 mg/dL   Creatinine, Ser 1.19 0.61 - 1.24 mg/dL   Calcium 8.4 (L) 8.9 - 10.3 mg/dL   GFR calc non Af Amer 56 (L) >60  mL/min   GFR calc Af Amer >60 >60 mL/min   Anion gap 10 5 - 15  CBC     Status: Abnormal   Collection Time: 06/30/17  1:15 PM  Result Value Ref Range   WBC 7.1 4.0 - 10.5 K/uL   RBC 4.14 (L) 4.22 - 5.81 MIL/uL   Hemoglobin 11.5 (L) 13.0 - 17.0 g/dL   HCT 95.636.0 (L) 21.339.0 - 08.652.0 %   MCV 87.0 78.0 - 100.0 fL   MCH 27.8 26.0 - 34.0 pg   MCHC 31.9 30.0 - 36.0 g/dL   RDW 57.816.0 (H) 46.911.5 - 62.915.5 %   Platelets 199 150 - 400 K/uL  I-Stat Troponin, ED ([facility])     Status: None   Collection Time: 06/30/17  1:27 PM  Result Value Ref Range   Troponin i, poc 0.02 0.00 - 0.08 ng/mL   Comment 3          Lactic acid, plasma     Status: Abnormal   Collection Time: 06/30/17  2:32 PM  Result Value Ref Range   Lactic Acid, Venous 2.3 (HH) 0.5 - 1.9 mmol/L   Dg Chest 2 View  Result Date: 06/30/2017 CLINICAL DATA:  Weakness, cough and shortness of breath EXAM: CHEST  2 VIEW COMPARISON:  Report 07/04/2006 FINDINGS: Non inclusion of left CP angle. There are  small bilateral pleural effusions with adjacent bibasilar atelectasis. Mild cardiomegaly. Aortic atherosclerosis. No pneumothorax. IMPRESSION: 1. Small bilateral pleural effusions with bibasilar atelectasis 2. Mild cardiomegaly Electronically Signed   By: Jasmine PangKim  Fujinaga M.D.   On: 06/30/2017 14:18     ASSESSMENT: Pt is a  76 yo with reported CHF  Presents with SOB and swelling  [facility]   Pt with evid of volume overload on exam    Agree with admit  Diurese with IV lasix  Follow BP and renal function Echo to define wall motion  Further work up based on results    2  DM with neuropathy    IM to follow  3  Dementia.

## 2017-06-30 NOTE — ED Notes (Signed)
EMT X 3 attempted to obtain pt 2nd set of blood cultures.

## 2017-06-30 NOTE — ED Notes (Signed)
Attempted report 

## 2017-06-30 NOTE — ED Notes (Signed)
Pt states we can't have his blood because he has rabies.

## 2017-06-30 NOTE — ED Notes (Signed)
Lactic acid of 2.3. Dr. Patria Maneampos informed.

## 2017-06-30 NOTE — H&P (Signed)
History and Physical    Devin Merritt. JWJ:[id number] DOB: June 06, 1941 DOA: 06/30/2017  Referring MD/NP/PA: er PCP: Shelbie Ammons, MD Outpatient Specialists:  Patient coming from: home with caregiver  Chief Complaint: shortness of breath  HPI: Devin Merritt. is a 76 y.o. male with medical history significant of EF of 25%, decubitus, DM, transaminates who was recently at [facility] from 7/19-7/21.  He presented to [facility] sent by PCP for elevated LFTs.  There were thought to be elevated from venous congestion.  HE had an echo there that showed EF 25%, global hypokinesis and grade 3 diastolic dsfn.  Family has been unable to weight patient but has notice increased swelling in his LE.  Patient is a poor historian and unable to provide information other than his "butt hurts".  Family states he has been unbale to lay flat and coughing more than usual.   IN the ER, BP was low.  BNP pending, LFTs pending.  Records were received from [place] and reviewed.    Review of Systems: unable to complete due to patient's mental status   Past Medical History:  Diagnosis Date  . Charcot's joint of left ankle   . Diabetes mellitus without complication (HCC)   . Diabetic neuropathy (HCC)   . Nonpalpable pulse   . Peripheral angiopathy (HCC)     History reviewed. No pertinent surgical history.   reports that he smoked 40 years ago. He has never used smokeless tobacco. He reports that he does not drink alcohol or use drugs.  Allergies  Allergen Reactions  . Sulfa Antibiotics Other (See Comments)     Family Hx: +DM and CAD  Prior to Admission medications   Medication Sig Start Date End Date Taking? Authorizing Provider  acetaminophen (TYLENOL) 325 MG tablet Take 650 mg by mouth every 6 (six) hours as needed.    [provider]  aspirin 81 MG tablet Take 81 mg by mouth daily.    [provider]  atorvastatin (LIPITOR) 10 MG tablet Take 10 mg by mouth daily.     [provider]  carvedilol (COREG) 12.5 MG tablet Take 12.5 mg by mouth 2 (two) times daily with a meal.    [provider]  fluticasone (FLONASE) 50 MCG/ACT nasal spray Place 2 sprays into both nostrils daily.    [provider]  furosemide (LASIX) 20 MG tablet Take 20 mg by mouth daily.    [provider]  HYDROcodone-acetaminophen (NORCO) 5-325 MG tablet Take 1 tablet by mouth every 6 (six) hours as needed for moderate pain.    [provider]  Insulin Glargine (LANTUS SOLOSTAR) 100 UNIT/ML Solostar Pen Inject into the skin daily at 10 pm. Inject 5units subcutaneously daily at bedtime    [provider]  levofloxacin (LEVAQUIN) 750 MG tablet Take 750 mg by mouth daily.    [provider]  lisinopril (PRINIVIL,ZESTRIL) 2.5 MG tablet Take 2.5 mg by mouth daily.    [provider]  omeprazole (PRILOSEC) 20 MG capsule Take 20 mg by mouth daily.    [provider]  ondansetron (ZOFRAN) 4 MG tablet Take 4 mg by mouth every 8 (eight) hours as needed for nausea or vomiting.    [provider]  polyethylene glycol powder (MIRALAX) powder Take 1 Container by mouth daily.    [provider]  Prenatal Vit-Fe Fumarate-FA (PRENATAL VITAMIN PO) Take by mouth daily.    [provider]  senna (SENOKOT) 8.6 MG tablet Take 1  tablet by mouth 2 (two) times daily.    [provider]  simvastatin (ZOCOR) 20 MG tablet Take 20 mg by mouth daily.    [provider]  spironolactone (ALDACTONE) 25 MG tablet Take 25 mg by mouth 2 (two) times daily.    [provider]  tamsulosin (FLOMAX) 0.4 MG CAPS capsule Take 0.4 mg by mouth daily.    [provider]  thiamine (VITAMIN B-1) 100 MG tablet Take 100 mg by mouth daily.    [provider]    Physical Exam: Vitals:   06/30/17 1545 06/30/17 1615 06/30/17 1630 06/30/17 1700  BP: 94/73 101/74 102/73 105/80  Pulse: 80 79 79  82  Resp: (!) 29  (!) 26 (!) 24  Temp:      TempSrc:      SpO2: 100% 100% 100% 100%      Constitutional: chronically ill disheveled male- on O2 but no increased work of breathing Vitals:   06/30/17 1545 06/30/17 1615 06/30/17 1630 06/30/17 1700  BP: 94/73 101/74 102/73 105/80  Pulse: 80 79 79 82  Resp: (!) 29  (!) 26 (!) 24  Temp:      TempSrc:      SpO2: 100% 100% 100% 100%   Eyes: PERRL, lids and conjunctivae normal ENMT: Mucous membranes are moist. Posterior pharynx clear of any exudate or lesions.Normal dentition.  Neck: normal, supple, no masses, no thyromegaly Respiratory:  Diminished with crackles Cardiovascular: Regular rate and rhythm, no murmurs / rubs / gallops. + LE Edema and deformity of joints, has weeping wounds on b/l legs under TED hose Abdomen: no tenderness, no masses palpated. No hepatosplenomegaly. Bowel sounds positive.  Musculoskeletal: no clubbing / cyanosis. No joint deformity upper and lower extremities. Good ROM, no contractures. Normal muscle tone.  Skin: see above Neurologic: not cooperative Psychiatric: alert but not able to participate in exam   Labs on Admission: I have personally reviewed following labs and imaging studies  CBC:  Recent Labs Lab 06/30/17 1315  WBC 7.1  HGB 11.5*  HCT 36.0*  MCV 87.0  PLT 199   Basic Metabolic Panel:  Recent Labs Lab 06/30/17 1315  NA 128*  K 4.4  CL 93*  CO2 25  GLUCOSE 189*  BUN 27*  CREATININE 1.23  CALCIUM 8.4*   GFR: CrCl cannot be calculated (Unknown ideal weight.). Liver Function Tests: No results for input(s): AST, ALT, ALKPHOS, BILITOT, PROT, ALBUMIN in the last 168 hours. No results for input(s): LIPASE, AMYLASE in the last 168 hours. No results for input(s): AMMONIA in the last 168 hours. Coagulation Profile: No results for input(s): INR, PROTIME in the last 168 hours. Cardiac Enzymes: No results for input(s): CKTOTAL, CKMB, CKMBINDEX, TROPONINI in the last 168 hours. BNP  (last 3 results) No results for input(s): PROBNP in the last 8760 hours. HbA1C: No results for input(s): HGBA1C in the last 72 hours. CBG: No results for input(s): GLUCAP in the last 168 hours. Lipid Profile: No results for input(s): CHOL, HDL, LDLCALC, TRIG, CHOLHDL, LDLDIRECT in the last 72 hours. Thyroid Function Tests: No results for input(s): TSH, T4TOTAL, FREET4, T3FREE, THYROIDAB in the last 72 hours. Anemia Panel: No results for input(s): VITAMINB12, FOLATE, FERRITIN, TIBC, IRON, RETICCTPCT in the last 72 hours. Urine analysis: No results found for: COLORURINE, APPEARANCEUR, LABSPEC, PHURINE, GLUCOSEU, HGBUR, BILIRUBINUR, KETONESUR, PROTEINUR, UROBILINOGEN, NITRITE, LEUKOCYTESUR Sepsis Labs: Invalid input(s): PROCALCITONIN, LACTICIDVEN No results found for this or any previous visit (from the past 240 hour(s)).  Radiological Exams on Admission: Dg Chest 2 View  Result Date: 06/30/2017 CLINICAL DATA:  Weakness, cough and shortness of breath EXAM: CHEST  2 VIEW COMPARISON:  Report 07/04/2006 FINDINGS: Non inclusion of left CP angle. There are small bilateral pleural effusions with adjacent bibasilar atelectasis. Mild cardiomegaly. Aortic atherosclerosis. No pneumothorax. IMPRESSION: 1. Small bilateral pleural effusions with bibasilar atelectasis 2. Mild cardiomegaly Electronically Signed   By: Jasmine PangKim  Fujinaga M.D.   On: 06/30/2017 14:18      Assessment/Plan Active Problems:   CHF (congestive heart failure) (HCC)   Hyponatremia   Transaminitis   Acute systolic CHF (congestive heart failure) (HCC)   Diabetes (HCC)   Decubitus ulcer   Acute systolic and diastolic CHF with b/l pleural effusions -cardiology consult -gentle diuresis -SDU for low BP -foley to be place for strict I/Os -daily weights (not able to do at home due to charcot's foot -poor overall prognosis- not sure he is a candidate for intervention such as AICD [facility]  Hyponatremia -monitor -fluid restrict  transaminitis -await LFTs -hold statin -[facility] Wall thickening of unknown significance, no ductal dilitation  Uncontrolled DM with Charcot joint -SSI And continue home injectable insulin  Dementia -seems to be at baseline -lives with niece who takes care of him and acts as his MPOA  DVT prophylaxis: lovenox Code Status: full Family Communication: niece at bedside Disposition Plan:  Consults called: cards Admission status: inpt   Klein Willcox Juanetta GoslingU Tobiah Celestine DO [facility] Pager [phone]- [phone]  If 7PM-7AM, please contact night-coverage [email or web address] Password [email or web address]  06/30/2017, 5:17 PM

## 2017-06-30 NOTE — ED Provider Notes (Signed)
[facility]-EMERGENCY DEPT Provider Note   CSN: [id number] Arrival date & time: 06/30/17  1306     History   Chief Complaint Chief Complaint  Patient presents with  . Chest Pain   Level 5 caveat due to confusion.  HPI Devin Centerheodore Terwilliger Jr. is a 76 y.o. male who has a pmh of CHF, and poorly controlled diabetes mellitus. History is given by the patient's healthcare power of attorney and niece. The patient tells me that he experienced some racing and skipping in his heart. This morning. He was feeling short of breath and had chest pain. He is unable to tell me how long it lasted. According to his knees and his healthcare power of attorney. The patient was just released from [facility]. He was therefore chest pain and volume overload. According to them, they were unable to diurese him secondary to hypotension. They were considering placement of a cardiac defibrillator, however, stated that they did not have the availability to do it at that facility. The patient was discharged 2 days ago. He has been complaining of persistent shortness of breath and chest pain since that time. He has chronic bilateral peripheral edema and ulceration with weeping. He has home health care for these wounds.  HPI  Past Medical History:  Diagnosis Date  . Charcot's joint of left ankle   . Diabetes mellitus without complication (HCC)   . Diabetic neuropathy (HCC)   . Nonpalpable pulse   . Peripheral angiopathy (HCC)     There are no active problems to display for this patient.   No past surgical history on file.     Home Medications    Prior to Admission medications   Medication Sig Start Date End Date Taking? Authorizing Provider  acetaminophen (TYLENOL) 325 MG tablet Take 650 mg by mouth every 6 (six) hours as needed.    [provider]  aspirin 81 MG tablet Take 81 mg by mouth daily.    [provider]  atorvastatin (LIPITOR) 10 MG tablet Take 10 mg by mouth daily.    [provider]  carvedilol (COREG) 12.5 MG tablet Take 12.5 mg by mouth 2 (two) times daily with a meal.    [provider]  fluticasone (FLONASE) 50 MCG/ACT nasal spray Place 2 sprays into both nostrils daily.    [provider]  furosemide (LASIX) 20 MG tablet Take 20 mg by mouth daily.    [provider]  HYDROcodone-acetaminophen (NORCO) 5-325 MG tablet Take 1 tablet by mouth every 6 (six) hours as needed for moderate pain.    [provider]  Insulin Glargine (LANTUS SOLOSTAR) 100 UNIT/ML Solostar Pen Inject into the skin daily at 10 pm. Inject 5units subcutaneously daily at bedtime    [provider]  levofloxacin (LEVAQUIN) 750 MG tablet Take 750 mg by mouth daily.    [provider]  lisinopril (PRINIVIL,ZESTRIL) 2.5 MG tablet Take 2.5 mg by mouth daily.    [provider]  omeprazole (PRILOSEC) 20 MG capsule Take 20 mg by mouth daily.    [provider]  ondansetron (ZOFRAN) 4 MG tablet Take 4 mg by mouth every 8 (eight) hours as needed for nausea or vomiting.    [provider]  polyethylene glycol powder (MIRALAX) powder Take 1 Container by mouth daily.    [provider]  Prenatal Vit-Fe Fumarate-FA (PRENATAL VITAMIN PO) Take by mouth daily.    [provider]  senna (SENOKOT) 8.6 MG tablet Take 1 tablet  by mouth 2 (two) times daily.    [provider]  simvastatin (ZOCOR) 20 MG tablet Take 20 mg by mouth daily.    [provider]  spironolactone (ALDACTONE) 25 MG tablet Take 25 mg by mouth 2 (two) times daily.    [provider]  tamsulosin (FLOMAX) 0.4 MG CAPS capsule Take 0.4 mg by mouth daily.    [provider]  thiamine (VITAMIN B-1) 100 MG tablet Take 100 mg by mouth daily.    [provider]    Family History No family history on file.  Social History Social History  Substance Use Topics  . Smoking status: Never Smoker  .  Smokeless tobacco: Not on file  . Alcohol use No     Allergies   Sulfa antibiotics   Review of Systems Review of Systems  Ten systems reviewed and are negative for acute change, except as noted in the HPI.   Physical Exam Updated Vital Signs SpO2 98%   Physical Exam  Constitutional: He appears well-developed and well-nourished. No distress.  HENT:  Head: Normocephalic and atraumatic.  Eyes: Conjunctivae are normal. No scleral icterus.  Neck: Normal range of motion. Neck supple.  Cardiovascular: Normal rate, regular rhythm and normal heart sounds.   BL peripheral edema and charcot joint  Pulmonary/Chest: Effort normal and breath sounds normal. No respiratory distress.  Abdominal: Soft. There is no tenderness.  Musculoskeletal: He exhibits no edema.  Neurological: He is alert.  Skin: Skin is warm and dry. He is not diaphoretic.  Psychiatric: His behavior is normal.  Nursing note and vitals reviewed.    ED Treatments / Results  Labs (all labs ordered are listed, but only abnormal results are displayed) Labs Reviewed - No data to display  EKG  EKG Interpretation None       Radiology No results found.  Procedures Procedures (including critical care time)  Medications Ordered in ED Medications - No data to display   Initial Impression / Assessment and Plan / ED Course  I have reviewed the triage vital signs and the nursing notes.  Pertinent labs & imaging results that were available during my care of the patient were reviewed by me and considered in my medical decision making (see chart for details).  Clinical Course as of Jun 30 1953  Mon Jun 30, 2017  1349 Sodium: (!) 128 [AH]  1350 Glucose: (!) 189 [AH]  1350 WBC: 7.1 [AH]  1350 Troponin i, poc: 0.02 [AH]  1608 Sodium: (!) 128 [AH]    Clinical Course User Index [AH] Arthor Captain, PA-C   Review of the patient's records at [facility] show that the patient had a repeat echocardiogram that  showed an EF of 25% with dilated cardiomyopathy. He also had a run of non-sustained ventricular tachycardia. Consideration for cardiac defibrillator was made. However, patient was discharged and asked to follow up with EP specialist. His chest x-ray today shows bilateral pleural effusions and he still appears volume overloaded with soft systolic blood pressure. I doubt any infectious etiology. The patient likely has intravascular volume depletion causing his elevated lactic acid. Hyponatremic, which is chronic. The patient will be admitted to the hospitalist service. Cardiology will be involved in his care. Patient seen in shared visit with attending physician. Who agrees with assessment, work up , treatment, and plan for admission   Final Clinical Impressions(s) / ED Diagnoses   Final diagnoses:  Acute on chronic combined systolic and diastolic CHF (congestive heart failure) (HCC)  Sacral ulcer, limited to breakdown of skin (HCC)  Failure to thrive in adult    New Prescriptions New Prescriptions   No medications on file     Delos Haring 06/30/17 1956    Azalia Bilis, MD 07/01/17 [phone]

## 2017-06-30 NOTE — ED Notes (Signed)
Got patient some warm blankets  

## 2017-06-30 NOTE — ED Triage Notes (Signed)
Pt arrives from home via [facility]. Pt states he has been having intermittent CP x 1week and was recently [facility]. Pt denies pain upon arrival to ED.

## 2017-07-01 DIAGNOSIS — E873 Alkalosis: Secondary | ICD-10-CM

## 2017-07-01 DIAGNOSIS — R740 Nonspecific elevation of levels of transaminase and lactic acid dehydrogenase [LDH]: Secondary | ICD-10-CM

## 2017-07-01 DIAGNOSIS — E11610 Type 2 diabetes mellitus with diabetic neuropathic arthropathy: Secondary | ICD-10-CM

## 2017-07-01 DIAGNOSIS — E118 Type 2 diabetes mellitus with unspecified complications: Secondary | ICD-10-CM

## 2017-07-01 DIAGNOSIS — E1165 Type 2 diabetes mellitus with hyperglycemia: Secondary | ICD-10-CM

## 2017-07-01 DIAGNOSIS — I5041 Acute combined systolic (congestive) and diastolic (congestive) heart failure: Secondary | ICD-10-CM

## 2017-07-01 DIAGNOSIS — E871 Hypo-osmolality and hyponatremia: Secondary | ICD-10-CM

## 2017-07-01 LAB — BASIC METABOLIC PANEL
ANION GAP: 11 (ref 5–15)
BUN: 30 mg/dL — ABNORMAL HIGH (ref 6–20)
CALCIUM: 8.1 mg/dL — AB (ref 8.9–10.3)
CO2: 26 mmol/L (ref 22–32)
CREATININE: 1.26 mg/dL — AB (ref 0.61–1.24)
Chloride: 96 mmol/L — ABNORMAL LOW (ref 101–111)
GFR, EST NON AFRICAN AMERICAN: 54 mL/min — AB (ref >60)
Glucose, Bld: 131 mg/dL — ABNORMAL HIGH (ref 65–99)
Potassium: 4.2 mmol/L (ref 3.5–5.1)
SODIUM: 133 mmol/L — AB (ref 135–145)

## 2017-07-01 LAB — LIPID PANEL
CHOL/HDL RATIO: 5.4 ratio
Cholesterol: 102 mg/dL (ref 0–200)
HDL: 19 mg/dL — AB (ref >40)
LDL CALC: 66 mg/dL (ref 0–99)
Triglycerides: 86 mg/dL (ref <150)
VLDL: 17 mg/dL (ref 0–40)

## 2017-07-01 LAB — GLUCOSE, CAPILLARY
GLUCOSE-CAPILLARY: 152 mg/dL — AB (ref 65–99)
Glucose-Capillary: 128 mg/dL — ABNORMAL HIGH (ref 65–99)
Glucose-Capillary: 239 mg/dL — ABNORMAL HIGH (ref 65–99)
Glucose-Capillary: 133 mg/dL — ABNORMAL HIGH (ref 65–99)

## 2017-07-01 LAB — BLOOD GAS, ARTERIAL
ACID-BASE EXCESS: 1 mmol/L (ref 0.0–2.0)
BICARBONATE: 23.3 mmol/L (ref 20.0–28.0)
Drawn by: 406621
FIO2: 21
O2 SAT: 99 %
PH ART: 7.55 — AB (ref 7.350–7.450)
Patient temperature: 98.6
pCO2 arterial: 26.7 mmHg — ABNORMAL LOW (ref 32.0–48.0)
pO2, Arterial: 112 mmHg — ABNORMAL HIGH (ref 83.0–108.0)

## 2017-07-01 LAB — TROPONIN I
TROPONIN I: 0.03 ng/mL — AB (ref <0.03)
Troponin I: 0.05 ng/mL (ref <0.03)

## 2017-07-01 MED ORDER — FUROSEMIDE 10 MG/ML IJ SOLN
80.0000 mg | Freq: Two times a day (BID) | INTRAMUSCULAR | Status: DC
  Administered 2017-07-01 – 2017-07-05 (×9): 80 mg via INTRAVENOUS
  Filled 2017-07-01 (×11): qty 8

## 2017-07-01 MED ORDER — DIPHENHYDRAMINE HCL 12.5 MG/5ML PO ELIX
12.5000 mg | ORAL_SOLUTION | Freq: Once | ORAL | Status: AC
  Administered 2017-07-01: 12.5 mg via ORAL
  Filled 2017-07-01: qty 10

## 2017-07-01 MED ORDER — IPRATROPIUM-ALBUTEROL 0.5-2.5 (3) MG/3ML IN SOLN
3.0000 mL | RESPIRATORY_TRACT | Status: DC | PRN
  Administered 2017-07-01: 3 mL via RESPIRATORY_TRACT
  Filled 2017-07-01: qty 3

## 2017-07-01 NOTE — Progress Notes (Signed)
Pt c/o inability to void. Prev voided 200cc this am. Bladder scan showed 788ml in bladder. Notified Dr. Joseph ArtWoods. Await orders. Cont to monitor. Emelda Brothershristy Seleni Meller RN

## 2017-07-01 NOTE — Progress Notes (Signed)
   07/01/17 1100  Clinical Encounter Type  Visited With Patient and family together  Visit Type Initial;Spiritual support  Referral From Nurse  Consult/Referral To Chaplain  Spiritual Encounters  Spiritual Needs Sacred text;Prayer;Emotional  Stress Factors  Patient Stress Factors Exhausted;Health changes  Family Stress Factors None identified    Chaplain followed up with page. Patient asked for prayer for strength and healing. [employer] General[employer]fered ministry of presence, emotional support, and prayer. Geanette Buonocore L. Salomon FickBanks, MDiv

## 2017-07-01 NOTE — Progress Notes (Signed)
PROGRESS NOTE    Devin Merritt.  ZOX:[id number] DOB: 08-Sep-1941 DOA: 06/30/2017 PCP: Shelbie Ammons, MD   Brief Narrative:  76 y.o. WM PMHx Chronic Systolic and Diastolic CHF (LVEF of 25%), Decubitus, DM type 2, Transaminates who was recently at [facility] from 7/19-7/21.   Presented to [facility] sent by PCP for elevated LFTs.  There were thought to be elevated from venous congestion.  HE had an echo there that showed EF 25%, global hypokinesis and grade 3 diastolic dsfn.  Family states patient is weight at physicians office~200 pounds. Family has notice increased swelling in his LE.  Patient is a poor historian and unable to provide information other than his "butt hurts".  Family states he has been unbale to lay flat and coughing more than usual.    Subjective: 7/24 A/O 4, negative CP, positive SOB (chronic per family), negative N/V, negative abdominal pain. Not on home O2.    Assessment & Plan:   Active Problems:   CHF (congestive heart failure) (HCC)   Hyponatremia   Transaminitis   Acute systolic CHF (congestive heart failure) (HCC)   Diabetes (HCC)   Decubitus ulcer   Acute systolic and diastolic CHF with b/l pleural effusions -Diuresis per cardiology gentle diuresis -Strict I&O -Daily weight -poor overall prognosis- not sure he is a candidate for intervention such as AICD mentioned to the family at [facility]  Respiratory Alkalosis -Most likely secondary patient's pleural effusion and CHF. (Some pulmonary edema also my read) -Should improve with diuresis -PCXR on 7/25   Hyponatremia -monitor -fluid restrict  transaminitis -await LFTs -hold statin -RUQ U/S done at [facility] thickening of unknown significance, no ductal dilitation  Uncontrolled DM with Charcot joint -Hemoglobin A1c, lipid panel pending -SSI And continue home injectable insulin  Dementia -seems to be at baseline -lives with niece who takes care of him and acts as his  MPOA       DVT prophylaxis: Lovenox Code Status: Full Family Communication: Children present Disposition Plan: per cardiology   Consultants:  Cardiology    Procedures/Significant Events:  None   VENTILATOR SETTINGS: None   Cultures None  Antimicrobials: None   Devices None   LINES / TUBES:  None    Continuous Infusions: . sodium chloride       Objective: Vitals:   07/01/17 0045 07/01/17 0500 07/01/17 0732 07/01/17 1056  BP: 94/60 94/68 98/69  103/75  Pulse: 83 82 80 82  Resp: (!) 22 20 19  (!) 31  Temp: 98.3 F (36.8 C) (!) 97.5 F (36.4 C) 97.7 F (36.5 C) (!) 97.4 F (36.3 C)  TempSrc: Oral Oral Oral Oral  SpO2: 96% 98% 100% 100%  Weight:  196 lb 4.8 oz (89 kg)      Intake/Output Summary (Last 24 hours) at 07/01/17 1117 Last data filed at 07/01/17 0500  Gross per 24 hour  Intake                0 ml  Output              325 ml  Net             -325 ml   Filed Weights   07/01/17 0500  Weight: 196 lb 4.8 oz (89 kg)    Examination:  General: A/O 4, No acute respiratory distress, positive chronic respiratory distress Eyes: negative scleral hemorrhage, negative anisocoria, negative icterus ENT: Negative Runny nose, negative gingival bleeding, Neck:  Negative scars, masses, torticollis, lymphadenopathy, JVD  Lungs: Clear to auscultation bilaterally without wheezes or crackles Cardiovascular: Regular rate and rhythm without murmur gallop or rub normal S1 and S2 Abdomen: negative abdominal pain, nondistended, positive soft, bowel sounds, no rebound, no ascites, no appreciable mass Extremities: No significant cyanosis, clubbing. Bilateral lower extremity edema +1. LLE Charcot's foot Skin: Negative rashes, lesions, ulcers Psychiatric:  Negative depression, negative anxiety, negative fatigue, negative mania  Central nervous system:  Cranial nerves II through XII intact, tongue/uvula midline, all extremities muscle strength 5/5, sensation  intact throughout, fnegative dysarthria, negative expressive aphasia, negative receptive aphasia.  .     Data Reviewed: Care during the described time interval was provided by me .  I have reviewed this patient's available data, including medical history, events of note, physical examination, and all test results as part of my evaluation. I have personally reviewed and interpreted all radiology studies.  CBC:  Recent Labs Lab 06/30/17 1315  WBC 7.1  HGB 11.5*  HCT 36.0*  MCV 87.0  PLT 199   Basic Metabolic Panel:  Recent Labs Lab 06/30/17 1315 06/30/17 1942 07/01/17 0645  NA 128*  --  133*  K 4.4  --  4.2  CL 93*  --  96*  CO2 25  --  26  GLUCOSE 189*  --  131*  BUN 27*  --  30*  CREATININE 1.23 1.26* 1.26*  CALCIUM 8.4*  --  8.1*   GFR: CrCl cannot be calculated (Unknown ideal weight.). Liver Function Tests:  Recent Labs Lab 06/30/17 1942  AST 20  ALT 119*  ALKPHOS 93  BILITOT 1.2  PROT 6.2*  ALBUMIN 2.9*   No results for input(s): LIPASE, AMYLASE in the last 168 hours. No results for input(s): AMMONIA in the last 168 hours. Coagulation Profile: No results for input(s): INR, PROTIME in the last 168 hours. Cardiac Enzymes:  Recent Labs Lab 06/30/17 1942 07/01/17 0135 07/01/17 0645  TROPONINI 0.04* 0.03* 0.05*   BNP (last 3 results) No results for input(s): PROBNP in the last 8760 hours. HbA1C: No results for input(s): HGBA1C in the last 72 hours. CBG:  Recent Labs Lab 06/30/17 2213 07/01/17 0734 07/01/17 1054  GLUCAP 193* 128* 152*   Lipid Profile: No results for input(s): CHOL, HDL, LDLCALC, TRIG, CHOLHDL, LDLDIRECT in the last 72 hours. Thyroid Function Tests: No results for input(s): TSH, T4TOTAL, FREET4, T3FREE, THYROIDAB in the last 72 hours. Anemia Panel: No results for input(s): VITAMINB12, FOLATE, FERRITIN, TIBC, IRON, RETICCTPCT in the last 72 hours. Urine analysis: No results found for: COLORURINE, APPEARANCEUR, LABSPEC,  PHURINE, GLUCOSEU, HGBUR, BILIRUBINUR, KETONESUR, PROTEINUR, UROBILINOGEN, NITRITE, LEUKOCYTESUR Sepsis Labs: @LABRCNTIP (procalcitonin:4,lacticidven:4)  )No results found for this or any previous visit (from the past 240 hour(s)).       Radiology Studies: Dg Chest 2 View  Result Date: 06/30/2017 CLINICAL DATA:  Weakness, cough and shortness of breath EXAM: CHEST  2 VIEW COMPARISON:  Report 07/04/2006 FINDINGS: Non inclusion of left CP angle. There are small bilateral pleural effusions with adjacent bibasilar atelectasis. Mild cardiomegaly. Aortic atherosclerosis. No pneumothorax. IMPRESSION: 1. Small bilateral pleural effusions with bibasilar atelectasis 2. Mild cardiomegaly Electronically Signed   By: Jasmine PangKim  Fujinaga M.D.   On: 06/30/2017 14:18        Scheduled Meds: . aspirin EC  81 mg Oral Daily  . carvedilol  12.5 mg Oral BID WC  . enoxaparin (LOVENOX) injection  40 mg Subcutaneous Q24H  . furosemide  80 mg Intravenous Q12H  . insulin aspart  0-5 Units Subcutaneous  QHS  . insulin aspart  0-9 Units Subcutaneous TID WC  . insulin glargine  5 Units Subcutaneous QHS  . lisinopril  2.5 mg Oral Daily  . pantoprazole  40 mg Oral Daily  . sodium chloride flush  3 mL Intravenous Q12H  . tamsulosin  0.4 mg Oral Daily   Continuous Infusions: . sodium chloride       LOS: 1 day    Time spent: 40 minutes    WOODS, Roselind Messier, MD [facility] Pager [phone]   If 7PM-7AM, please contact night-coverage [email or web address] Password [facility] 07/01/2017, 11:17 AM

## 2017-07-01 NOTE — Progress Notes (Signed)
Notified [place] Strad[place] PA of pts SBP maintaining mid 80's after receiving meds this am. New parameters to be place. Continue to monitor. Emelda Brothershristy Cornella Emmer RN

## 2017-07-01 NOTE — Progress Notes (Signed)
Pt finally voided. No longer having any discomfort. Cont to monitor. Emelda Brothershristy Lillyahna Hemberger RN

## 2017-07-01 NOTE — Progress Notes (Signed)
Progress Note  Patient Name: Devin Merritt. Date of Encounter: 07/01/2017  Primary Cardiologist: New    Subjective   Breathing a little short last night with chest pressure   OK now    Inpatient Medications    Scheduled Meds: . aspirin EC  81 mg Oral Daily  . carvedilol  12.5 mg Oral BID WC  . enoxaparin (LOVENOX) injection  40 mg Subcutaneous Q24H  . furosemide  20 mg Intravenous Q12H  . insulin aspart  0-5 Units Subcutaneous QHS  . insulin aspart  0-9 Units Subcutaneous TID WC  . insulin glargine  5 Units Subcutaneous QHS  . lisinopril  2.5 mg Oral Daily  . pantoprazole  40 mg Oral Daily  . sodium chloride flush  3 mL Intravenous Q12H  . tamsulosin  0.4 mg Oral Daily   Continuous Infusions: . sodium chloride     PRN Meds: sodium chloride, acetaminophen, ondansetron (ZOFRAN) IV, sodium chloride flush   Vital Signs    Vitals:   06/30/17 2045 07/01/17 0045 07/01/17 0500 07/01/17 0732  BP: 93/64 94/60 94/68  98/69  Pulse: 82 83 82 80  Resp: (!) 25 (!) 22 20 (!) 9  Temp: 98.4 F (36.9 C) 98.3 F (36.8 C) (!) 97.5 F (36.4 C) 97.7 F (36.5 C)  TempSrc: Oral Oral Oral Oral  SpO2: 98% 96% 98% 100%  Weight:   196 lb 4.8 oz (89 kg)     Intake/Output Summary (Last 24 hours) at 07/01/17 0759 Last data filed at 07/01/17 0500  Gross per 24 hour  Intake                0 ml  Output              325 ml  Net             -325 ml   Filed Weights   07/01/17 0500  Weight: 196 lb 4.8 oz (89 kg)    Telemetry   SR   - Personally Reviewed  ECG     Physical Exam   GEN: No acute distress.   Neck: No JVD Cardiac: RRR, no murmurs, rubs, or gallops.  Respiratory: Clear to auscultation bilaterally. GI: Soft, nontender, non-distended  MS: No edema; No deformity.  1+ edema   Neuro:  Nonfocal  Psych: Normal affect   Labs    Chemistry Recent Labs Lab 06/30/17 1315 06/30/17 1942  NA 128*  --   K 4.4  --   CL 93*  --   CO2 25  --   GLUCOSE 189*  --   BUN  27*  --   CREATININE 1.23 1.26*  CALCIUM 8.4*  --   PROT  --  6.2*  ALBUMIN  --  2.9*  AST  --  20  ALT  --  119*  ALKPHOS  --  93  BILITOT  --  1.2  GFRNONAA 56* 54*  GFRAA >60 >60  ANIONGAP 10  --      Hematology Recent Labs Lab 06/30/17 1315  WBC 7.1  RBC 4.14*  HGB 11.5*  HCT 36.0*  MCV 87.0  MCH 27.8  MCHC 31.9  RDW 16.0*  PLT 199    Cardiac Enzymes Recent Labs Lab 06/30/17 1942 07/01/17 0135  TROPONINI 0.04* 0.03*    Recent Labs Lab 06/30/17 1327  TROPIPOC 0.02     BNP Recent Labs Lab 06/30/17 1315  BNP 2,137.1*     DDimer No results for input(s): DDIMER  in the last 168 hours.   Radiology    Dg Chest 2 View  Result Date: 06/30/2017 CLINICAL DATA:  Weakness, cough and shortness of breath EXAM: CHEST  2 VIEW COMPARISON:  Report 07/04/2006 FINDINGS: Non inclusion of left CP angle. There are small bilateral pleural effusions with adjacent bibasilar atelectasis. Mild cardiomegaly. Aortic atherosclerosis. No pneumothorax. IMPRESSION: 1. Small bilateral pleural effusions with bibasilar atelectasis 2. Mild cardiomegaly Electronically Signed   By: Jasmine PangKim  Fujinaga M.D.   On: 06/30/2017 14:18    Cardiac Studies   Echo pending    Patient Profile     76 y.o. male with history of CHF  No full work up done   Presented yesterday with chest pressure and SOB    Assessment & Plan    1  SOB / edema  Wold continue diuresis   Echo pending       Signed, Dietrich PatesPaula Leilynn Pilat, MD  07/01/2017, 7:59 AM

## 2017-07-01 NOTE — Progress Notes (Signed)
Nutrition Brief Note  Patient identified on the Malnutrition Screening Tool (MST) Report for unintentional weight loss. Spoke with patient's great niece who reports that patient has lost weight due to fluid loss with CHF treatment. His usual weight is ~185-190 lbs, he was up to 200 lbs PTA, now down to 196 lbs. He usually has a good appetite and eats well, up until a few days ago when he started feeling bad. Since admission, his appetite and intake is back to normal, consuming 75-100% of meals (heart healthy/CHO modified).  Nutrition-Focused physical exam completed. Findings are no fat depletion, no muscle depletion, and mild edema.   Labs and medications reviewed.   No nutrition interventions warranted at this time. If nutrition issues arise, please consult RD.   Joaquin CourtsKimberly Harris, RD, LDN, CNSC Pager [phone] After Hours Pager [phone]

## 2017-07-02 ENCOUNTER — Inpatient Hospital Stay (HOSPITAL_COMMUNITY): Payer: PPO

## 2017-07-02 DIAGNOSIS — R627 Adult failure to thrive: Secondary | ICD-10-CM

## 2017-07-02 DIAGNOSIS — I360 Nonrheumatic tricuspid (valve) stenosis: Secondary | ICD-10-CM

## 2017-07-02 LAB — COMPREHENSIVE METABOLIC PANEL
ALK PHOS: 78 U/L (ref 38–126)
ALT: 78 U/L — AB (ref 17–63)
AST: 20 U/L (ref 15–41)
Albumin: 2.4 g/dL — ABNORMAL LOW (ref 3.5–5.0)
Anion gap: 9 (ref 5–15)
BUN: 29 mg/dL — ABNORMAL HIGH (ref 6–20)
CALCIUM: 8.2 mg/dL — AB (ref 8.9–10.3)
CO2: 26 mmol/L (ref 22–32)
CREATININE: 1.5 mg/dL — AB (ref 0.61–1.24)
Chloride: 98 mmol/L — ABNORMAL LOW (ref 101–111)
GFR, EST AFRICAN AMERICAN: 51 mL/min — AB (ref >60)
GFR, EST NON AFRICAN AMERICAN: 44 mL/min — AB (ref >60)
Glucose, Bld: 133 mg/dL — ABNORMAL HIGH (ref 65–99)
Potassium: 3.7 mmol/L (ref 3.5–5.1)
Sodium: 133 mmol/L — ABNORMAL LOW (ref 135–145)
TOTAL PROTEIN: 5.4 g/dL — AB (ref 6.5–8.1)
Total Bilirubin: 0.7 mg/dL (ref 0.3–1.2)

## 2017-07-02 LAB — HEMOGLOBIN A1C
HEMOGLOBIN A1C: 8.7 % — AB (ref 4.8–5.6)
Mean Plasma Glucose: 203 mg/dL

## 2017-07-02 LAB — GLUCOSE, CAPILLARY
GLUCOSE-CAPILLARY: 141 mg/dL — AB (ref 65–99)
GLUCOSE-CAPILLARY: 152 mg/dL — AB (ref 65–99)
Glucose-Capillary: 141 mg/dL — ABNORMAL HIGH (ref 65–99)
Glucose-Capillary: 212 mg/dL — ABNORMAL HIGH (ref 65–99)

## 2017-07-02 LAB — BLOOD GAS, ARTERIAL
ACID-BASE EXCESS: 2.5 mmol/L — AB (ref 0.0–2.0)
BICARBONATE: 24.6 mmol/L (ref 20.0–28.0)
Drawn by: 41977
O2 CONTENT: 3 L/min
O2 SAT: 99.2 %
PCO2 ART: 25.8 mmHg — AB (ref 32.0–48.0)
PO2 ART: 122 mmHg — AB (ref 83.0–108.0)
Patient temperature: 98.6
pH, Arterial: 7.586 — ABNORMAL HIGH (ref 7.350–7.450)

## 2017-07-02 LAB — LIPID PANEL
Cholesterol: 100 mg/dL (ref 0–200)
HDL: 21 mg/dL — ABNORMAL LOW (ref >40)
LDL CALC: 68 mg/dL (ref 0–99)
Total CHOL/HDL Ratio: 4.8 RATIO
Triglycerides: 57 mg/dL (ref <150)
VLDL: 11 mg/dL (ref 0–40)

## 2017-07-02 LAB — ECHOCARDIOGRAM COMPLETE
HEIGHTINCHES: 66 in
WEIGHTICAEL: 3125.24 [oz_av]

## 2017-07-02 LAB — MAGNESIUM: Magnesium: 2 mg/dL (ref 1.7–2.4)

## 2017-07-02 MED ORDER — HYDROCERIN EX CREA
TOPICAL_CREAM | Freq: Two times a day (BID) | CUTANEOUS | Status: DC
  Administered 2017-07-03 – 2017-07-05 (×4): via TOPICAL
  Administered 2017-07-06: 1 via TOPICAL
  Administered 2017-07-06 – 2017-07-08 (×4): via TOPICAL
  Filled 2017-07-02: qty 113

## 2017-07-02 MED ORDER — DIPHENHYDRAMINE HCL 25 MG PO CAPS
25.0000 mg | ORAL_CAPSULE | Freq: Every evening | ORAL | Status: DC | PRN
  Administered 2017-07-02 – 2017-07-05 (×2): 25 mg via ORAL
  Filled 2017-07-02 (×2): qty 1

## 2017-07-02 MED ORDER — CARVEDILOL 3.125 MG PO TABS
3.1250 mg | ORAL_TABLET | Freq: Two times a day (BID) | ORAL | Status: DC
  Administered 2017-07-04 – 2017-07-07 (×4): 3.125 mg via ORAL
  Filled 2017-07-02 (×7): qty 1

## 2017-07-02 NOTE — Progress Notes (Signed)
Progress Note  Patient Name: Devin Centerheodore Schnarr Jr. Date of Encounter: 07/02/2017  Primary Cardiologist: New    Subjective   Breathing is OK now Was short last night  No CP    Inpatient Medications    Scheduled Meds: . aspirin EC  81 mg Oral Daily  . carvedilol  3.125 mg Oral BID WC  . enoxaparin (LOVENOX) injection  40 mg Subcutaneous Q24H  . furosemide  80 mg Intravenous Q12H  . insulin aspart  0-5 Units Subcutaneous QHS  . insulin aspart  0-9 Units Subcutaneous TID WC  . insulin glargine  5 Units Subcutaneous QHS  . pantoprazole  40 mg Oral Daily  . sodium chloride flush  3 mL Intravenous Q12H  . tamsulosin  0.4 mg Oral Daily   Continuous Infusions: . sodium chloride     PRN Meds: sodium chloride, acetaminophen, ipratropium-albuterol, ondansetron (ZOFRAN) IV, sodium chloride flush   Vital Signs    Vitals:   07/01/17 2323 07/02/17 0001 07/02/17 0400 07/02/17 0739  BP: 94/65 93/64 93/63  94/69  Pulse: 80 80 72 76  Resp: (!) 27 (!) 25 19 (!) 24  Temp:   98 F (36.7 C) 97.7 F (36.5 C)  TempSrc:   Axillary Axillary  SpO2: (!) 88% 97% 98% 100%  Weight:   195 lb 5.2 oz (88.6 kg)   Height:   5\' 6"  (1.676 m)     Intake/Output Summary (Last 24 hours) at 07/02/17 1012 Last data filed at 07/02/17 0501  Gross per 24 hour  Intake              480 ml  Output             1800 ml  Net            -1320 ml   Filed Weights   07/01/17 0500 07/02/17 0400  Weight: 196 lb 4.8 oz (89 kg) 195 lb 5.2 oz (88.6 kg)    Telemetry    SR   - Personally Reviewed  ECG      Physical Exam  Frail 76 yo in NAD   GEN: No acute distress.   Neck: No JVD Cardiac: RRR, no murmurs, rubs, or gallops.  Respiratory: Clear to auscultation bilaterally. GI: Soft, nontender, non-distended  Ext:  Tr edema   Neuro:  Nonfocal  Psych: Normal affect   Labs    Chemistry Recent Labs Lab 06/30/17 1315 06/30/17 1942 07/01/17 0645 07/02/17 0247  NA 128*  --  133* 133*  K 4.4  --  4.2 3.7    CL 93*  --  96* 98*  CO2 25  --  26 26  GLUCOSE 189*  --  131* 133*  BUN 27*  --  30* 29*  CREATININE 1.23 1.26* 1.26* 1.50*  CALCIUM 8.4*  --  8.1* 8.2*  PROT  --  6.2*  --  5.4*  ALBUMIN  --  2.9*  --  2.4*  AST  --  20  --  20  ALT  --  119*  --  78*  ALKPHOS  --  93  --  78  BILITOT  --  1.2  --  0.7  GFRNONAA 56* 54* 54* 44*  GFRAA >60 >60 >60 51*  ANIONGAP 10  --  11 9     Hematology Recent Labs Lab 06/30/17 1315  WBC 7.1  RBC 4.14*  HGB 11.5*  HCT 36.0*  MCV 87.0  MCH 27.8  MCHC 31.9  RDW 16.0*  PLT 199    Cardiac Enzymes Recent Labs Lab 06/30/17 1942 07/01/17 0135 07/01/17 0645  TROPONINI 0.04* 0.03* 0.05*    Recent Labs Lab 06/30/17 1327  TROPIPOC 0.02     BNP Recent Labs Lab 06/30/17 1315  BNP 2,137.1*     DDimer No results for input(s): DDIMER in the last 168 hours.   Radiology    Dg Chest 2 View  Result Date: 06/30/2017 CLINICAL DATA:  Weakness, cough and shortness of breath EXAM: CHEST  2 VIEW COMPARISON:  Report 07/04/2006 FINDINGS: Non inclusion of left CP angle. There are small bilateral pleural effusions with adjacent bibasilar atelectasis. Mild cardiomegaly. Aortic atherosclerosis. No pneumothorax. IMPRESSION: 1. Small bilateral pleural effusions with bibasilar atelectasis 2. Mild cardiomegaly Electronically Signed   By: Jasmine PangKim  Fujinaga M.D.   On: 06/30/2017 14:18   Dg Chest Port 1 View  Result Date: 07/02/2017 CLINICAL DATA:  Pleural effusion DM Peripheral angiopathy Diabetic neuropathy EXAM: PORTABLE CHEST - 1 VIEW COMPARISON:  06/30/2017 FINDINGS: Perihilar and bibasilar interstitial edema or infiltrates, slightly increased periods small pleural effusions left greater than right as before.Some increase in left retrocardiac consolidation/ atelectasis. Heart size upper limits normal for technique.  Atheromatous aorta. Visualized bones unremarkable.   No pneumothorax. IMPRESSION: 1. Worsening interstitial edema. 2. Small pleural  effusions with worsening left retrocardiac consolidation/atelectasis. Electronically Signed   By: Corlis Leak  Hassell M.D.   On: 07/02/2017 08:30    Cardiac Studies    Patient Profile     76 y.o. male admited with SOB and   Assessment & Plan    SOB/edema  Clinically volume is improved  Pt had SOB last night Lungs are clear  Still awaiting echo  I have pulled back on meds as BP is marginal       Signed, Dietrich PatesPaula Cheetara Hoge, MD  07/02/2017, 10:12 AM

## 2017-07-02 NOTE — Consult Note (Signed)
WOC Nurse wound consult note Reason for Consult: stage II sacrum and bilateral venous ulcers Wound type:venous and pressure Pressure Injury POA: Yes Measurement:Left medial malleolar area 5cm x 5cm peeling macerated area with three partial thickness wounds, 1cm circle x 0.1cm, 100% pink bed, 2cm x 1.4cm x 0.1cm 100% tan slough, and 2cm circle x 0.1cm with 80% pink bed, 20% tan slough, no drainage from wounds but area is macerated from weeping.  Right just lateral to pretibial area 2.5cm x 3.2cm x 0.1cm with 100% bed yellow slough, wet, periwound intact. Sacrum has 1cm stage II with 8cm area of darkened blanchable skin.  Wound bed:see above Drainage (amount, consistency, odor) see above Periwound:see above Dressing procedure/placement/frequency:Pt had foam dressings on that were placed last week at a different hospital and he was discharged with them on.  Family said were not instructed to remove them. I have provided nurses with orders for To venous ulcers on bilateral lower legs, cleanse with NS, pat dry, apply NS moistened gauze, cover with dry gauze, wrap with kerlix and adhere without tape on skin, perform BID. Also for continuing foam to sacrum for protection.  Pt states he always wears TED hose.  New TED hose ordered due to his were soiled.  Have ordered Prevalon boots for heel protection. Also ordered Eucerin cream for knees.  They are scaly and calloused, family states he crawled through house. We will not follow, but will remain available to this patient, to nursing, and the medical and/or surgical teams.  Please re-consult if we need to assist further.   Barnett HatterMelinda Miel Wisener, RN-C, WTA-C, [facility] Wound Treatment Associate

## 2017-07-02 NOTE — Progress Notes (Signed)
[facility] TEAM 1 - Stepdown/ICU TEAM  Kennith Centerheodore Thies Jr.  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  He [facility] by his PCP for elevated LFTs, and [facility]   Subjective: Pt reports that his sob is "a little better" but family note he is still not back to his baseline.  No chest pain, n/v, or abdom pain.    Family reports he was diagnosed w/ CHF ~7326yr ago, but that his severe sx have seemed to have developed over the last few weeks.    Assessment & Plan:  Acute exacerbation of chronic systolic and diastolic CHF with b/l pleural effusions EF as low as 15% via TTE this admit, w/ grade 3 DD - diuresis being limited by hypotension as well as worsening renal failure - we may very well be dealing w/ end-stage CHF at this point - extent of prior investigation unclear presently - cont to diurese as able - ultimately may best benefit from Palliative Care consult   Hyponatremia Due to severe CHF - follow  Acute kidney injury In setting of greatly diminished CO as well as attempts to diurese - no ACEi/ARB - follow w/ ongoing attempts to diurese   [facility] noted GB wall thickening of unknown significance but no ductal dilitation - LTS approaching normal at this time - 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  A1c 8.7 - CBG reasonably controlled at this time   Dementia lives with niece who takes care of him and acts as his MPOA  Goals of care Apart from medical management there is not likely much else that can be done for this gentleman - Cards is assisting - I discussed GOC w/ the pt directly, and he presently desires full code status, but I don't feel confident that  his cognitive state is one which allows him to fully process his predicament - will consider PC consult    DVT prophylaxis: lovenox Code Status: FULL CODE Family Communication: spoke w/ grand-niece at bedside  Disposition Plan:   [facility]  [facility]  Procedures: 7/25 TTE  Antimicrobials:  none  Objective: Blood pressure 111/75, pulse 89, temperature 97.7 F (36.5 C), temperature source Axillary, resp. rate 20, height 5\' 6"  (1.676 m), weight 88.6 kg (195 lb 5.2 oz), SpO2 98 %.  Intake/Output Summary (Last 24 hours) at 07/02/17 1553 Last data filed at 07/02/17 0501  Gross per 24 hour  Intake              360 ml  Output             1800 ml  Net            -1440 ml   Filed Weights   07/01/17 0500 07/02/17 0400  Weight: 89 kg (196 lb 4.8 oz) 88.6 kg (195 lb 5.2 oz)    Examination: General: No acute respiratory distress at rest in bed  Lungs: poor air movement in B bases - no wheezing  Cardiovascular: distant faint HS - RRR - no gallup or rub noted  Abdomen: Nontender, nondistended, soft, bowel sounds positive, no rebound, no ascites, no appreciable mass Extremities: 2+ doughy B LE edema   CBC:  Recent Labs Lab 06/30/17 1315  WBC 7.1  HGB 11.5*  HCT 36.0*  MCV 87.0  PLT 199   Basic Metabolic Panel:  Recent Labs Lab 06/30/17 1315 06/30/17 1942 07/01/17 0645 07/02/17 0247  NA 128*  --  133* 133*  K 4.4  --  4.2 3.7  CL 93*  --  96* 98*  CO2 25  --  26 26  GLUCOSE 189*  --  131* 133*  BUN 27*  --  30* 29*  CREATININE 1.23 1.26* 1.26* 1.50*  CALCIUM 8.4*  --  8.1* 8.2*  MG  --   --   --  2.0   GFR: Estimated Creatinine Clearance: 44.4 mL/min (A) (by C-G formula based on SCr of 1.5 mg/dL (H)).  Liver Function Tests:  Recent Labs Lab 06/30/17 1942 07/02/17 0247  AST 20 20  ALT 119* 78*  ALKPHOS 93 78  BILITOT 1.2 0.7  PROT 6.2* 5.4*  ALBUMIN 2.9* 2.4*    Cardiac Enzymes:  Recent Labs Lab 06/30/17 1942 07/01/17 0135  07/01/17 0645  TROPONINI 0.04* 0.03* 0.05*    HbA1C: Hgb A1c MFr Bld  Date/Time Value Ref Range Status  07/01/2017 02:42 PM 8.7 (H) 4.8 - 5.6 % Final    Comment:    (NOTE)         Pre-diabetes: 5.7 - 6.4         Diabetes: >6.4         Glycemic control for adults with diabetes: <7.0     CBG:  Recent Labs Lab 07/01/17 1054 07/01/17 1634 07/01/17 2123 07/02/17 0742 07/02/17 1119  GLUCAP 152* 239* 133* 141* 141*    Recent Results (from the past 240 hour(s))  Blood culture (routine x 2)     Status: None (Preliminary result)   Collection Time: 06/30/17  3:39 PM  Result Value Ref Range Status   Specimen Description BLOOD RIGHT HAND  Final   Special Requests   Final    BOTTLES DRAWN AEROBIC AND ANAEROBIC Blood Culture adequate volume   Culture NO GROWTH 2 DAYS  Final   Report Status PENDING  Incomplete  Culture, blood (Routine X 2) w Reflex to ID Panel     Status: None (Preliminary result)   Collection Time: 06/30/17  8:44 PM  Result Value Ref Range Status   Specimen Description BLOOD LEFT ANTECUBITAL  Final   Special Requests   Final    BOTTLES DRAWN AEROBIC AND ANAEROBIC Blood Culture adequate volume   Culture NO GROWTH 2 DAYS  Final   Report Status PENDING  Incomplete     Scheduled Meds: . aspirin EC  81 mg Oral Daily  . carvedilol  3.125 mg Oral BID WC  . enoxaparin (LOVENOX) injection  40 mg Subcutaneous Q24H  . furosemide  80 mg Intravenous Q12H  . hydrocerin   Topical BID  . insulin aspart  0-5 Units Subcutaneous QHS  . insulin aspart  0-9 Units Subcutaneous TID WC  . insulin glargine  5 Units Subcutaneous QHS  . pantoprazole  40 mg Oral Daily  . sodium chloride flush  3 mL Intravenous Q12H  . tamsulosin  0.4 mg Oral Daily     LOS: 2 days   Lonia BloodJeffrey T. Younes Degeorge, MD [facility] Office  [phone] Pager - Text Page per Loretha StaplerAmion as per below:  On-Call/Text Page:      [email or web address]      password [email or web address]  If 7PM-7AM, please contact  night-coverage [email or web address] Password [email or web address] 07/02/2017, 3:53 PM

## 2017-07-02 NOTE — Consult Note (Signed)
[facility] CM Primary Care Navigator  07/02/2017  Devin Merritt. Apr 19, 1941 [id number]   Met withpatient and great niece Vikki Ports) at the bedside to identify possible discharge needs. Patient was eating lunch during this visit. Patient's great niecereports that he had shortness of breath and chest pain at home that led to this admission. Patient's primary care provideris Dr. Katheran Awe with [facility], per great niece.  Patient is using [facility]- pharmacy in [place] to obtain medications without any problem at present.   Patient's niece (Darla- POA) and great niece manages his medications at home using "pill box" system filled weekly.   Patient lives with niece and great niece who takes care of him and provide transportation tohisdoctors'appointments.   Patient's niece is the primary caregiver for patient at home and great niece provides care for patient while niece is at work as stated.   Discharge plan is undetermined for now per great niece. Awaiting physician's order for discharge.   Patient's great niece voiced understanding to call hisprimary care provider's officewhen he gets back homefor a post discharge follow-upwithin a week or sooner if needed.Patient letter (with PCP's contact number) was provided as a reminder.  Explained to patient and great niece about [facility] CM services available for health management. Great niece reports that DM is being managed at home.  Per great niece, unable to monitor patient's weight since he is not able to stand up and mostly uses wheelchair.    Opted and verbally agreedat this time, forEMMI HF calls to follow-up with his recovery and help with management at home.   Referral was made for EMMI HFcalls after discharge.  For questions, please contact:  Dannielle Huh, BSN, RN- [facility] Primary Care Navigator  Telephone: [phone] [place]

## 2017-07-02 NOTE — Progress Notes (Signed)
*  PRELIMINARY RESULTS* Echocardiogram 2D Echocardiogram has been performed.  Jeryl Columbialliott, Latanya Hemmer 07/02/2017, 11:50 AM

## 2017-07-02 NOTE — Progress Notes (Signed)
error 

## 2017-07-02 NOTE — Progress Notes (Signed)
Pt ran 6 beats of NSVT, no sign of distress noted.

## 2017-07-03 DIAGNOSIS — F0150 Vascular dementia without behavioral disturbance: Secondary | ICD-10-CM

## 2017-07-03 DIAGNOSIS — J90 Pleural effusion, not elsewhere classified: Secondary | ICD-10-CM

## 2017-07-03 LAB — GLUCOSE, CAPILLARY
GLUCOSE-CAPILLARY: 171 mg/dL — AB (ref 65–99)
GLUCOSE-CAPILLARY: 168 mg/dL — AB (ref 65–99)
GLUCOSE-CAPILLARY: 177 mg/dL — AB (ref 65–99)
Glucose-Capillary: 132 mg/dL — ABNORMAL HIGH (ref 65–99)

## 2017-07-03 LAB — BASIC METABOLIC PANEL
ANION GAP: 8 (ref 5–15)
BUN: 26 mg/dL — ABNORMAL HIGH (ref 6–20)
CALCIUM: 8 mg/dL — AB (ref 8.9–10.3)
CO2: 29 mmol/L (ref 22–32)
Chloride: 94 mmol/L — ABNORMAL LOW (ref 101–111)
Creatinine, Ser: 1.42 mg/dL — ABNORMAL HIGH (ref 0.61–1.24)
GFR, EST AFRICAN AMERICAN: 54 mL/min — AB (ref >60)
GFR, EST NON AFRICAN AMERICAN: 47 mL/min — AB (ref >60)
Glucose, Bld: 194 mg/dL — ABNORMAL HIGH (ref 65–99)
Potassium: 3.8 mmol/L (ref 3.5–5.1)
SODIUM: 131 mmol/L — AB (ref 135–145)

## 2017-07-03 LAB — HEMOGLOBIN A1C
Hgb A1c MFr Bld: 9 % — ABNORMAL HIGH (ref 4.8–5.6)
MEAN PLASMA GLUCOSE: 212 mg/dL

## 2017-07-03 LAB — MAGNESIUM: Magnesium: 2.2 mg/dL (ref 1.7–2.4)

## 2017-07-03 MED ORDER — SODIUM CHLORIDE 0.9 % IV SOLN: 250.0000 mL | INTRAVENOUS | Status: DC | PRN

## 2017-07-03 MED ORDER — INSULIN GLARGINE 100 UNIT/ML ~~LOC~~ SOLN
8.0000 [IU] | Freq: Every day | SUBCUTANEOUS | Status: DC
  Administered 2017-07-03: 8 [IU] via SUBCUTANEOUS
  Filled 2017-07-03 (×2): qty 0.08

## 2017-07-03 MED ORDER — SODIUM CHLORIDE 0.9% FLUSH
3.0000 mL | Freq: Two times a day (BID) | INTRAVENOUS | Status: DC
  Administered 2017-07-03 (×2): 3 mL via INTRAVENOUS

## 2017-07-03 MED ORDER — ASPIRIN 81 MG PO CHEW
81.0000 mg | CHEWABLE_TABLET | ORAL | Status: AC
  Administered 2017-07-04: 81 mg via ORAL
  Filled 2017-07-03: qty 1

## 2017-07-03 MED ORDER — SODIUM CHLORIDE 0.9% FLUSH: 3.0000 mL | INTRAVENOUS | Status: DC | PRN

## 2017-07-03 MED ORDER — SODIUM CHLORIDE 0.9 % IV SOLN
INTRAVENOUS | Status: DC
  Administered 2017-07-04: 06:00:00 via INTRAVENOUS

## 2017-07-03 NOTE — Clinical Social Work Note (Signed)
Clinical Social Work Assessment  Patient Details  Name: Devin Merritt. MRN: [id number] Date of Birth: Nov 21, 1941  Date of referral:  07/03/17               Reason for consult:  Facility Placement (possible facility placement, pending PT/OT)                Permission sought to share information with:  Family Supports Permission granted to share information::  No (patient asleep, great-niece Chelsea at bedside)  Name::     Jerrell Mylar  Agency::     Relationship::  Niece   Contact Information:  [phone]   Housing/Transportation Living arrangements for the past 2 months:  [place] of Information:  Other (Comment Required) (great-niece at bedside) Patient Interpreter Needed:  None Criminal Activity/Legal Involvement Pertinent to Current Situation/Hospitalization:  No - Comment as needed Significant Relationships:  Other Family Members Lives with:  Relatives Do you feel safe going back to the place where you live?    Need for family participation in patient care:  Yes (Comment)  Care giving concerns: Patient is from home with family. Patient stays in a mobile home on the family's property. Great-niece Chelsea is at bedside. (Chelsea's mother listed as patient's contact.) Patient not yet assessed by PT.   Social Worker assessment / plan: CSW met with great-niece at bedside; patient was asleep. Great-niece reports patient lives in a mobile home on family's property. Patient has a foot deformity and has gotten around using a wheelchair and ambulating on knees, which niece reports patient has done for 30+ years. Niece reports patient had home health PT in the past and did not like it, so will likely not be agreeable to SNF. CSW to continue to follow and support with disposition planning if SNF recommended. Follow up with patient when awake and alert.    Employment status:  Retired Forensic scientist:  Other (Comment Required) ([employer]) PT  Recommendations:    Information / Referral to community resources:     Patient/Family's Response to care: Patient asleep. Niece appreciative of care.  Patient/Family's Understanding of and Emotional Response to Diagnosis, Current Treatment, and Prognosis: Niece with good understanding of patient's medical condition and past medical history. Niece hopeful for patient recovery and open to home health services. May be open to SNF but niece wary that patient will not agree.  Emotional Assessment Appearance:  Appears stated age Attitude/Demeanor/Rapport:  Unable to Assess (patient asleep) Affect (typically observed):  Unable to Assess (patient asleep) Orientation:  Oriented to Self, Oriented to Place, Oriented to Situation Alcohol / Substance use:  Not Applicable Psych involvement (Current and /or in the community):  No (Comment)  Discharge Needs  Concerns to be addressed:  Discharge Planning Concerns Readmission within the last 30 days:  No Current discharge risk:  Physical Impairment Barriers to Discharge:  Continued Medical Work up   Estanislado Emms, LCSW 07/03/2017, 2:50 PM

## 2017-07-03 NOTE — Plan of Care (Signed)
Problem: Safety: Goal: Ability to remain free from injury will improve Outcome: Progressing Family at bedside, bed locked and in low position, call light and personal items within reach, bed alarm on.  Problem: Pain Managment: Goal: General experience of comfort will improve Outcome: Progressing PO meds effective in keeping patient comfortable.

## 2017-07-03 NOTE — Progress Notes (Signed)
PROGRESS NOTE    Kennith Center.  ZOX:[id number] DOB: 1941/04/27 DOA: 06/30/2017 PCP: Shelbie Ammons, MD   Brief Narrative:  76 y.o. WM PMHx Chronic Systolic and Diastolic CHF (LVEF of 25%), Decubitus, DM type 2, Transaminates who was recently at [facility] from 7/19-7/21.   Presented to [facility] sent by PCP for elevated LFTs.  There were thought to be elevated from venous congestion.  HE had an echo there that showed EF 25%, global hypokinesis and grade 3 diastolic dsfn.  Family states patient is weight at physicians office~200 pounds. Family has notice increased swelling in his LE.  Patient is a poor historian and unable to provide information other than his "butt hurts".  Family states he has been unbale to lay flat and coughing more than usual.    Subjective: 7/26  A/O 4, negative CP, positive SOB (chronic per family), negative N/V, negative abdominal pain. Not on home O2.    Assessment & Plan:   Active Problems:   CHF (congestive heart failure) (HCC)   Hyponatremia   Transaminitis   Acute systolic CHF (congestive heart failure) (HCC)   Diabetes (HCC)   Decubitus ulcer   Acute systolic and diastolic CHF with b/l pleural effusions -Diuresis per cardiology gentle diuresis -Coreg 3.125 mg BID -Lasix 80 mg BID -Strict I&O since admission -3.0 L -Daily weight Filed Weights   07/02/17 0400 07/03/17 0413 07/03/17 1223  Weight: 195 lb 5.2 oz (88.6 kg) 196 lb 10.4 oz (89.2 kg) 197 lb 5 oz (89.5 kg)  -poor overall prognosis- not sure he is a candidate for intervention such as AICD mentioned to the family at [facility] -Scheduled for Right/Left heart catheterization on 7/27  Respiratory Alkalosis -Most likely secondary patient's pleural effusion and CHF. (Some pulmonary edema also my read) -Should improve with diuresis -PCXR on 7/25: Worsening interstitial edema/ worsening left retrocardiac consolidation/atelectasis. Hyponatremia  Recent Labs Lab 06/30/17 1315  07/01/17 0645 07/02/17 0247 07/03/17 0218  NA 128* 133* 133* 131*  -improving with fluid restriction  transaminitis -Improving -hold statin -RUQ U/S done at [facility] thickening of unknown significance, no ductal dilitation  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 -7/25 Hemoglobin A1c= 9.0 -Lipid panel within ADA guidelines -7/26 Increase Lantus 8 units daily -Sensitive SSI SSI And continue home injectable insulin  Dementia -seems to be at baseline -lives with niece who takes care of him and acts as his MPOA       DVT prophylaxis: Lovenox Code Status: Full Family Communication: Children present Disposition Plan: per cardiology   Consultants:  Cardiology    Procedures/Significant Events:  None   VENTILATOR SETTINGS: None   Cultures None  Antimicrobials: None   Devices None   LINES / TUBES:  None    Continuous Infusions:    Objective: Vitals:   07/02/17 2031 07/03/17 0012 07/03/17 0413 07/03/17 0738  BP: 90/61 122/60 102/68 103/71  Pulse: 82 86 87 85  Resp: (!) 21 (!) 27 18 19   Temp:  97.6 F (36.4 C) (!) 97.2 F (36.2 C) 97.9 F (36.6 C)  TempSrc:  Oral Oral Axillary  SpO2: 99% 98% 98% 98%  Weight:   196 lb 10.4 oz (89.2 kg)   Height:        Intake/Output Summary (Last 24 hours) at 07/03/17 0840 Last data filed at 07/03/17 0415  Gross per 24 hour  Intake              720 ml  Output  1875 ml  Net            -1155 ml   Filed Weights   07/01/17 0500 07/02/17 0400 07/03/17 0413  Weight: 196 lb 4.8 oz (89 kg) 195 lb 5.2 oz (88.6 kg) 196 lb 10.4 oz (89.2 kg)    Examination:  General: A/O 4, No acute respiratory distress, positive chronic respiratory distress Eyes: negative scleral hemorrhage, negative anisocoria, negative icterus ENT: Negative Runny nose, negative gingival bleeding, Neck:  Negative scars, masses, torticollis, lymphadenopathy, JVD Lungs: Clear to auscultation bilaterally without  wheezes or crackles Cardiovascular: Regular rate and rhythm without murmur gallop or rub normal S1 and S2 Abdomen: negative abdominal pain, nondistended, positive soft, bowel sounds, no rebound, no ascites, no appreciable mass Extremities: No significant cyanosis, clubbing. Bilateral lower extremity edema +1. LLE Charcot's foot Skin: Negative rashes, lesions, ulcers Psychiatric:  Negative depression, negative anxiety, negative fatigue, negative mania  Central nervous system:  Cranial nerves II through XII intact, tongue/uvula midline, all extremities muscle strength 5/5, sensation intact throughout, fnegative dysarthria, negative expressive aphasia, negative receptive aphasia.  .     Data Reviewed: Care during the described time interval was provided by me .  I have reviewed this patient's available data, including medical history, events of note, physical examination, and all test results as part of my evaluation. I have personally reviewed and interpreted all radiology studies.  CBC:  Recent Labs Lab 06/30/17 1315  WBC 7.1  HGB 11.5*  HCT 36.0*  MCV 87.0  PLT 199   Basic Metabolic Panel:  Recent Labs Lab 06/30/17 1315 06/30/17 1942 07/01/17 0645 07/02/17 0247 07/03/17 0218  NA 128*  --  133* 133* 131*  K 4.4  --  4.2 3.7 3.8  CL 93*  --  96* 98* 94*  CO2 25  --  26 26 29   GLUCOSE 189*  --  131* 133* 194*  BUN 27*  --  30* 29* 26*  CREATININE 1.23 1.26* 1.26* 1.50* 1.42*  CALCIUM 8.4*  --  8.1* 8.2* 8.0*  MG  --   --   --  2.0 2.2   GFR: Estimated Creatinine Clearance: 47 mL/min (A) (by C-G formula based on SCr of 1.42 mg/dL (H)). Liver Function Tests:  Recent Labs Lab 06/30/17 1942 07/02/17 0247  AST 20 20  ALT 119* 78*  ALKPHOS 93 78  BILITOT 1.2 0.7  PROT 6.2* 5.4*  ALBUMIN 2.9* 2.4*   No results for input(s): LIPASE, AMYLASE in the last 168 hours. No results for input(s): AMMONIA in the last 168 hours. Coagulation Profile: No results for input(s):  INR, PROTIME in the last 168 hours. Cardiac Enzymes:  Recent Labs Lab 06/30/17 1942 07/01/17 0135 07/01/17 0645  TROPONINI 0.04* 0.03* 0.05*   BNP (last 3 results) No results for input(s): PROBNP in the last 8760 hours. HbA1C:  Recent Labs  07/01/17 1442 07/02/17 0247  HGBA1C 8.7* 9.0*   CBG:  Recent Labs Lab 07/02/17 0742 07/02/17 1119 07/02/17 1624 07/02/17 2001 07/03/17 0729  GLUCAP 141* 141* 212* 152* 132*   Lipid Profile:  Recent Labs  07/01/17 1442 07/02/17 0247  CHOL 102 100  HDL 19* 21*  LDLCALC 66 68  TRIG 86 57  CHOLHDL 5.4 4.8   Thyroid Function Tests: No results for input(s): TSH, T4TOTAL, FREET4, T3FREE, THYROIDAB in the last 72 hours. Anemia Panel: No results for input(s): VITAMINB12, FOLATE, FERRITIN, TIBC, IRON, RETICCTPCT in the last 72 hours. Urine analysis: No results found for: COLORURINE, APPEARANCEUR,  LABSPEC, PHURINE, GLUCOSEU, HGBUR, BILIRUBINUR, KETONESUR, PROTEINUR, UROBILINOGEN, NITRITE, LEUKOCYTESUR Sepsis Labs: @LABRCNTIP (procalcitonin:4,lacticidven:4)  ) Recent Results (from the past 240 hour(s))  Blood culture (routine x 2)     Status: None (Preliminary result)   Collection Time: 06/30/17  3:39 PM  Result Value Ref Range Status   Specimen Description BLOOD RIGHT HAND  Final   Special Requests   Final    BOTTLES DRAWN AEROBIC AND ANAEROBIC Blood Culture adequate volume   Culture NO GROWTH 2 DAYS  Final   Report Status PENDING  Incomplete  Culture, blood (Routine X 2) w Reflex to ID Panel     Status: None (Preliminary result)   Collection Time: 06/30/17  8:44 PM  Result Value Ref Range Status   Specimen Description BLOOD LEFT ANTECUBITAL  Final   Special Requests   Final    BOTTLES DRAWN AEROBIC AND ANAEROBIC Blood Culture adequate volume   Culture NO GROWTH 2 DAYS  Final   Report Status PENDING  Incomplete         Radiology Studies: Dg Chest Port 1 View  Result Date: 07/02/2017 CLINICAL DATA:  Pleural  effusion DM Peripheral angiopathy Diabetic neuropathy EXAM: PORTABLE CHEST - 1 VIEW COMPARISON:  06/30/2017 FINDINGS: Perihilar and bibasilar interstitial edema or infiltrates, slightly increased periods small pleural effusions left greater than right as before.Some increase in left retrocardiac consolidation/ atelectasis. Heart size upper limits normal for technique.  Atheromatous aorta. Visualized bones unremarkable.   No pneumothorax. IMPRESSION: 1. Worsening interstitial edema. 2. Small pleural effusions with worsening left retrocardiac consolidation/atelectasis. Electronically Signed   By: Corlis Leak  Hassell M.D.   On: 07/02/2017 08:30        Scheduled Meds: . aspirin EC  81 mg Oral Daily  . carvedilol  3.125 mg Oral BID WC  . enoxaparin (LOVENOX) injection  40 mg Subcutaneous Q24H  . furosemide  80 mg Intravenous Q12H  . hydrocerin   Topical BID  . insulin aspart  0-5 Units Subcutaneous QHS  . insulin aspart  0-9 Units Subcutaneous TID WC  . insulin glargine  5 Units Subcutaneous QHS  . pantoprazole  40 mg Oral Daily  . tamsulosin  0.4 mg Oral Daily   Continuous Infusions:    LOS: 3 days    Time spent: 40 minutes    WOODS, Roselind MessierURTIS J, MD [facility] Pager [phone]   If 7PM-7AM, please contact night-coverage [email or web address] Password [email or web address] 07/03/2017, 8:40 AM

## 2017-07-03 NOTE — Progress Notes (Signed)
Progress Note  Patient Name: Devin Centerheodore Tangen Jr. Date of Encounter: 07/03/2017  Primary Cardiologist: New    Subjective   Breathing is up and down    Inpatient Medications    Scheduled Meds: . aspirin EC  81 mg Oral Daily  . carvedilol  3.125 mg Oral BID WC  . enoxaparin (LOVENOX) injection  40 mg Subcutaneous Q24H  . furosemide  80 mg Intravenous Q12H  . hydrocerin   Topical BID  . insulin aspart  0-5 Units Subcutaneous QHS  . insulin aspart  0-9 Units Subcutaneous TID WC  . insulin glargine  5 Units Subcutaneous QHS  . pantoprazole  40 mg Oral Daily  . tamsulosin  0.4 mg Oral Daily   Continuous Infusions:  PRN Meds: acetaminophen, diphenhydrAMINE, ipratropium-albuterol, ondansetron (ZOFRAN) IV   Vital Signs    Vitals:   07/02/17 2031 07/03/17 0012 07/03/17 0413 07/03/17 0738  BP: 90/61 122/60 102/68 103/71  Pulse: 82 86 87 85  Resp: (!) 21 (!) 27 18 19   Temp:  97.6 F (36.4 C) (!) 97.2 F (36.2 C) 97.9 F (36.6 C)  TempSrc:  Oral Oral Axillary  SpO2: 99% 98% 98% 98%  Weight:   196 lb 10.4 oz (89.2 kg)   Height:        Intake/Output Summary (Last 24 hours) at 07/03/17 0813 Last data filed at 07/03/17 0415  Gross per 24 hour  Intake              720 ml  Output             1875 ml  Net            -1155 ml   Net neg 2.3 L  Filed Weights   07/01/17 0500 07/02/17 0400 07/03/17 0413  Weight: 196 lb 4.8 oz (89 kg) 195 lb 5.2 oz (88.6 kg) 196 lb 10.4 oz (89.2 kg)    Telemetry    SR   - Personally Reviewed  ECG    Physical Exam  Frail   GEN: No acute distress.   Neck: No JVD Cardiac: RRR, no murmurs, rubs, or gallops.  Respiratory: Clear to auscultation bilaterally. GI: Soft, nontender, non-distended  MS: 1+ edema; No deformity. Neuro:  Nonfocal  Psych: Normal affect   Labs    Chemistry Recent Labs Lab 06/30/17 1942 07/01/17 0645 07/02/17 0247 07/03/17 0218  NA  --  133* 133* 131*  K  --  4.2 3.7 3.8  CL  --  96* 98* 94*  CO2  --   26 26 29   GLUCOSE  --  131* 133* 194*  BUN  --  30* 29* 26*  CREATININE 1.26* 1.26* 1.50* 1.42*  CALCIUM  --  8.1* 8.2* 8.0*  PROT 6.2*  --  5.4*  --   ALBUMIN 2.9*  --  2.4*  --   AST 20  --  20  --   ALT 119*  --  78*  --   ALKPHOS 93  --  78  --   BILITOT 1.2  --  0.7  --   GFRNONAA 54* 54* 44* 47*  GFRAA >60 >60 51* 54*  ANIONGAP  --  11 9 8      Hematology Recent Labs Lab 06/30/17 1315  WBC 7.1  RBC 4.14*  HGB 11.5*  HCT 36.0*  MCV 87.0  MCH 27.8  MCHC 31.9  RDW 16.0*  PLT 199    Cardiac Enzymes Recent Labs Lab 06/30/17 1942 07/01/17 0135  07/01/17 0645  TROPONINI 0.04* 0.03* 0.05*    Recent Labs Lab 06/30/17 1327  TROPIPOC 0.02     BNP Recent Labs Lab 06/30/17 1315  BNP 2,137.1*     DDimer No results for input(s): DDIMER in the last 168 hours.   Radiology    Dg Chest Port 1 View  Result Date: 07/02/2017 CLINICAL DATA:  Pleural effusion DM Peripheral angiopathy Diabetic neuropathy EXAM: PORTABLE CHEST - 1 VIEW COMPARISON:  06/30/2017 FINDINGS: Perihilar and bibasilar interstitial edema or infiltrates, slightly increased periods small pleural effusions left greater than right as before.Some increase in left retrocardiac consolidation/ atelectasis. Heart size upper limits normal for technique.  Atheromatous aorta. Visualized bones unremarkable.   No pneumothorax. IMPRESSION: 1. Worsening interstitial edema. 2. Small pleural effusions with worsening left retrocardiac consolidation/atelectasis. Electronically Signed   By: Corlis Leak  Hassell M.D.   On: 07/02/2017 08:30    Cardiac Studies     Patient Profile    Assessment & Plan    1  Acute on chronic systolic CHF  Echo yesterda yshowed severe LV dysfunction with LVEF 15%   Increased filling pressures  Discussed with family  Would recom R and L heart catheterization tomorrow to define anatomy   Getting IV lasix with relatively mild response  AWOuld nt advance for now    Signed, Dietrich PatesPaula Shaley Leavens, MD    07/03/2017, 8:13 AM

## 2017-07-04 ENCOUNTER — Encounter (HOSPITAL_COMMUNITY): Payer: Self-pay | Admitting: Cardiology

## 2017-07-04 ENCOUNTER — Encounter (HOSPITAL_COMMUNITY)
Admission: EM | Disposition: A | Discharge status: Hospice | Payer: Self-pay | Source: Home / Self Care | Attending: Internal Medicine

## 2017-07-04 DIAGNOSIS — I25110 Atherosclerotic heart disease of native coronary artery with unstable angina pectoris: Secondary | ICD-10-CM

## 2017-07-04 DIAGNOSIS — I2510 Atherosclerotic heart disease of native coronary artery without angina pectoris: Secondary | ICD-10-CM

## 2017-07-04 DIAGNOSIS — I5023 Acute on chronic systolic (congestive) heart failure: Secondary | ICD-10-CM

## 2017-07-04 DIAGNOSIS — I5043 Acute on chronic combined systolic (congestive) and diastolic (congestive) heart failure: Principal | ICD-10-CM

## 2017-07-04 DIAGNOSIS — E876 Hypokalemia: Secondary | ICD-10-CM

## 2017-07-04 DIAGNOSIS — T17908A Unspecified foreign body in respiratory tract, part unspecified causing other injury, initial encounter: Secondary | ICD-10-CM

## 2017-07-04 DIAGNOSIS — I5042 Chronic combined systolic (congestive) and diastolic (congestive) heart failure: Secondary | ICD-10-CM

## 2017-07-04 HISTORY — PX: RIGHT/LEFT HEART CATH AND CORONARY ANGIOGRAPHY: CATH118266

## 2017-07-04 LAB — CBC
HCT: 32.7 % — ABNORMAL LOW (ref 39.0–52.0)
HEMOGLOBIN: 10.3 g/dL — AB (ref 13.0–17.0)
MCH: 27.1 pg (ref 26.0–34.0)
MCHC: 31.5 g/dL (ref 30.0–36.0)
MCV: 86.1 fL (ref 78.0–100.0)
PLATELETS: 204 10*3/uL (ref 150–400)
RBC: 3.8 MIL/uL — AB (ref 4.22–5.81)
RDW: 16 % — ABNORMAL HIGH (ref 11.5–15.5)
WBC: 5.6 10*3/uL (ref 4.0–10.5)

## 2017-07-04 LAB — POCT I-STAT 3, VENOUS BLOOD GAS (G3P V)
Acid-Base Excess: 6 mmol/L — ABNORMAL HIGH (ref 0.0–2.0)
Bicarbonate: 31.4 mmol/L — ABNORMAL HIGH (ref 20.0–28.0)
O2 Saturation: 49 %
PCO2 VEN: 46 mmHg (ref 44.0–60.0)
TCO2: 33 mmol/L (ref 0–100)
pH, Ven: 7.442 — ABNORMAL HIGH (ref 7.250–7.430)
pO2, Ven: 26 mmHg — CL (ref 32.0–45.0)

## 2017-07-04 LAB — BASIC METABOLIC PANEL
Anion gap: 7 (ref 5–15)
BUN: 24 mg/dL — ABNORMAL HIGH (ref 6–20)
CO2: 30 mmol/L (ref 22–32)
CREATININE: 1.18 mg/dL (ref 0.61–1.24)
Calcium: 8 mg/dL — ABNORMAL LOW (ref 8.9–10.3)
Chloride: 95 mmol/L — ABNORMAL LOW (ref 101–111)
GFR, EST NON AFRICAN AMERICAN: 59 mL/min — AB (ref >60)
Glucose, Bld: 129 mg/dL — ABNORMAL HIGH (ref 65–99)
POTASSIUM: 3.4 mmol/L — AB (ref 3.5–5.1)
SODIUM: 132 mmol/L — AB (ref 135–145)

## 2017-07-04 LAB — GLUCOSE, CAPILLARY
GLUCOSE-CAPILLARY: 130 mg/dL — AB (ref 65–99)
Glucose-Capillary: 122 mg/dL — ABNORMAL HIGH (ref 65–99)
Glucose-Capillary: 140 mg/dL — ABNORMAL HIGH (ref 65–99)
Glucose-Capillary: 297 mg/dL — ABNORMAL HIGH (ref 65–99)
Glucose-Capillary: 81 mg/dL (ref 65–99)

## 2017-07-04 LAB — POTASSIUM: Potassium: 3.8 mmol/L (ref 3.5–5.1)

## 2017-07-04 LAB — POCT I-STAT 3, ART BLOOD GAS (G3+)
ACID-BASE EXCESS: 6 mmol/L — AB (ref 0.0–2.0)
BICARBONATE: 29.3 mmol/L — AB (ref 20.0–28.0)
O2 Saturation: 93 %
PO2 ART: 61 mmHg — AB (ref 83.0–108.0)
TCO2: 30 mmol/L (ref 0–100)
pCO2 arterial: 38.4 mmHg (ref 32.0–48.0)
pH, Arterial: 7.491 — ABNORMAL HIGH (ref 7.350–7.450)

## 2017-07-04 LAB — MAGNESIUM
MAGNESIUM: 2.2 mg/dL (ref 1.7–2.4)
Magnesium: 2.1 mg/dL (ref 1.7–2.4)

## 2017-07-04 LAB — PROTIME-INR
INR: 1.3
PROTHROMBIN TIME: 16.3 s — AB (ref 11.4–15.2)

## 2017-07-04 SURGERY — RIGHT/LEFT HEART CATH AND CORONARY ANGIOGRAPHY
Anesthesia: LOCAL

## 2017-07-04 MED ORDER — POTASSIUM CHLORIDE CRYS ER 20 MEQ PO TBCR
EXTENDED_RELEASE_TABLET | ORAL | Status: AC
  Filled 2017-07-04: qty 2

## 2017-07-04 MED ORDER — LIDOCAINE HCL (PF) 1 % IJ SOLN
INTRAMUSCULAR | Status: AC
  Filled 2017-07-04: qty 30

## 2017-07-04 MED ORDER — INSULIN GLARGINE 100 UNIT/ML ~~LOC~~ SOLN
10.0000 [IU] | Freq: Every day | SUBCUTANEOUS | Status: DC
  Filled 2017-07-04 (×2): qty 0.1

## 2017-07-04 MED ORDER — KETOROLAC TROMETHAMINE 15 MG/ML IJ SOLN
15.0000 mg | Freq: Once | INTRAMUSCULAR | Status: AC
  Administered 2017-07-04: 15 mg via INTRAVENOUS
  Filled 2017-07-04: qty 1

## 2017-07-04 MED ORDER — SODIUM CHLORIDE 0.9% FLUSH: 3.0000 mL | INTRAVENOUS | Status: DC | PRN

## 2017-07-04 MED ORDER — FENTANYL CITRATE (PF) 100 MCG/2ML IJ SOLN
INTRAMUSCULAR | Status: AC
  Filled 2017-07-04: qty 2

## 2017-07-04 MED ORDER — SODIUM CHLORIDE 0.9% FLUSH
3.0000 mL | Freq: Two times a day (BID) | INTRAVENOUS | Status: DC
  Administered 2017-07-04 – 2017-07-07 (×6): 3 mL via INTRAVENOUS

## 2017-07-04 MED ORDER — HEPARIN (PORCINE) IN NACL 2-0.9 UNIT/ML-% IJ SOLN
INTRAMUSCULAR | Status: AC | PRN
  Administered 2017-07-04: 1000 mL

## 2017-07-04 MED ORDER — VERAPAMIL HCL 2.5 MG/ML IV SOLN
INTRAVENOUS | Status: AC
  Filled 2017-07-04: qty 2

## 2017-07-04 MED ORDER — IOPAMIDOL (ISOVUE-370) INJECTION 76%
INTRAVENOUS | Status: DC | PRN
  Administered 2017-07-04: 60 mL via INTRA_ARTERIAL

## 2017-07-04 MED ORDER — ENOXAPARIN SODIUM 40 MG/0.4ML ~~LOC~~ SOLN
40.0000 mg | SUBCUTANEOUS | Status: DC
  Administered 2017-07-05 – 2017-07-07 (×3): 40 mg via SUBCUTANEOUS
  Filled 2017-07-04 (×3): qty 0.4

## 2017-07-04 MED ORDER — LIDOCAINE HCL (PF) 1 % IJ SOLN
INTRAMUSCULAR | Status: DC | PRN
  Administered 2017-07-04 (×2): 2 mL via SUBCUTANEOUS

## 2017-07-04 MED ORDER — SODIUM CHLORIDE 0.9 % IV SOLN: 250.0000 mL | INTRAVENOUS | Status: DC | PRN

## 2017-07-04 MED ORDER — MIDAZOLAM HCL 2 MG/2ML IJ SOLN
INTRAMUSCULAR | Status: DC | PRN
  Administered 2017-07-04: 1 mg via INTRAVENOUS

## 2017-07-04 MED ORDER — VERAPAMIL HCL 2.5 MG/ML IV SOLN
INTRAVENOUS | Status: DC | PRN
  Administered 2017-07-04: 10 mL via INTRA_ARTERIAL

## 2017-07-04 MED ORDER — IOPAMIDOL (ISOVUE-370) INJECTION 76%
INTRAVENOUS | Status: AC
  Filled 2017-07-04: qty 100

## 2017-07-04 MED ORDER — FENTANYL CITRATE (PF) 100 MCG/2ML IJ SOLN
INTRAMUSCULAR | Status: DC | PRN
  Administered 2017-07-04: 25 ug via INTRAVENOUS

## 2017-07-04 MED ORDER — POTASSIUM CHLORIDE CRYS ER 20 MEQ PO TBCR
40.0000 meq | EXTENDED_RELEASE_TABLET | Freq: Once | ORAL | Status: AC
Start: 2017-07-04 — End: 2017-07-04
  Administered 2017-07-04: 40 meq via ORAL

## 2017-07-04 MED ORDER — HEPARIN SODIUM (PORCINE) 1000 UNIT/ML IJ SOLN
INTRAMUSCULAR | Status: DC | PRN
  Administered 2017-07-04: 4500 [IU] via INTRAVENOUS

## 2017-07-04 MED ORDER — HEPARIN (PORCINE) IN NACL 2-0.9 UNIT/ML-% IJ SOLN
INTRAMUSCULAR | Status: AC
Start: 2017-07-04 — End: 2017-07-04
  Filled 2017-07-04: qty 1000

## 2017-07-04 MED ORDER — MIDAZOLAM HCL 2 MG/2ML IJ SOLN
INTRAMUSCULAR | Status: AC
  Filled 2017-07-04: qty 2

## 2017-07-04 SURGICAL SUPPLY — 13 items
CATH BALLN WEDGE 5F 110CM (CATHETERS) ×2 IMPLANT
CATH EXPO 5F FL3.5 (CATHETERS) ×2 IMPLANT
CATH INFINITI 5FR MULTPACK ANG (CATHETERS) ×2 IMPLANT
DEVICE RAD COMP TR BAND LRG (VASCULAR PRODUCTS) ×2 IMPLANT
GLIDESHEATH SLEND SS 6F .021 (SHEATH) ×2 IMPLANT
GUIDEWIRE INQWIRE 1.5J.035X260 (WIRE) ×1 IMPLANT
INQWIRE 1.5J .035X260CM (WIRE) ×2
KIT HEART LEFT (KITS) ×2 IMPLANT
PACK CARDIAC CATHETERIZATION (CUSTOM PROCEDURE TRAY) ×2 IMPLANT
SHEATH GLIDE SLENDER 4/5FR (SHEATH) ×2 IMPLANT
SYR MEDRAD MARK V 150ML (SYRINGE) ×2 IMPLANT
TRANSDUCER W/STOPCOCK (MISCELLANEOUS) ×2 IMPLANT
TUBING CIL FLEX 10 FLL-RA (TUBING) ×2 IMPLANT

## 2017-07-04 NOTE — Progress Notes (Signed)
I spoke with Elray McgregorMary Lynch, on call for [facility] concerning patient's BP of 90s/70s and CBG of 81. Patient sue to receive 80 mg lasix IV. Lynch said to hold due to BP. Also due to receive 10 units of lantus for first time tonight. Patient strict NPO status and only receiving normal saline KVO through IV. Lynch ordered to hold lantus as well for now.

## 2017-07-04 NOTE — Progress Notes (Signed)
PROGRESS NOTE    Devin Merritt.  ZOX:[id number] DOB: 1941-04-05 DOA: 06/30/2017 PCP: Shelbie Ammons, MD   Brief Narrative:  76 y.o. 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   Presented to [facility] sent by PCP for elevated LFTs.  There were thought to be elevated from venous congestion.  HE had an echo there that showed EF 25%, global hypokinesis and grade 3 diastolic dsfn.  Family states patient is weight at physicians office~200 pounds. Family has notice increased swelling in his LE.  Patient is a poor historian and unable to provide information other than his "butt hurts".  Family states he has been unbale to lay flat and coughing more than usual.    Subjective: 7/27 A/O 4, negative CP, positive acute on chronic SOB, positive aspiration of food and medication, negative N/V, negative abdominal pain. Not on home O2    .    Assessment & Plan:   Active Problems:   CHF (congestive heart failure) (HCC)   Hyponatremia   Transaminitis   Acute systolic CHF (congestive heart failure) (HCC)   Diabetes (HCC)   Decubitus ulcer   Acute systolic and diastolic CHF with b/l pleural effusions -Diuresis per cardiology gentle diuresis -Coreg 3.125 mg BID -Lasix 80 mg BID -Strict I&O since admission - 5.7 L -Daily weight Filed Weights   07/03/17 0413 07/03/17 1223 07/04/17 0500  Weight: 196 lb 10.4 oz (89.2 kg) 197 lb 5 oz (89.5 kg) 196 lb 13.9 oz (89.3 kg)  --poor overall prognosis -7/27 S/P Right/Left heart catheterization, severe disease not amenable to conservative management. See results below -Patient being evaluated for CABG. Given his multiple co-morbidities poor candidate. Await recommendations  Respiratory Alkalosis .-Most likely secondary patient's pleural effusion and CHF. (Some pulmonary edema also my read) -Should improve with diuresis  Aspiration -Patient with  multiple witnessed aspiration events of medicine and food. NPO -7/27 Requested swallow  -PCXR in the a.m.  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 -7/25 Hemoglobin A1c= 9.0 -Lipid panel within ADA guidelines -7/27 Increase Lantus 10 units daily -Sensitive SSI   Dementia -seems to be at baseline -lives with niece who takes care of him and acts as his HCPOA  Hypokalemia -Potassium goal> 4 -Potassium 40  MEq -Repeat K/Mg    DVT prophylaxis: Lovenox Code Status: Full Family Communication: Daughter present Disposition Plan: per cardiology   Consultants:  Cardiology    Procedures/Significant Events:  PCXR on 7/25: Worsening interstitial edema/ worsening left retrocardiac consolidation/atelectasis 7/27 Right/Left Heart cath:,-Mid RCA lesion, 90 %stenosed.-Acute Mrg lesion, 90 %stenosed.Suezanne Jacquet LAD lesion, 70 %stenosed.Suezanne Jacquet LAD to Prox LAD lesion, 95 %stenosed. -1st Diag lesion, 90 %stenosed.Suezanne Jacquet 2nd Diag to 2nd Diag lesion, 90 %stenosed. -Prox LAD to Mid LAD lesion, 85 %stenosed.-Ost Cx lesion, 99 %stenosed. -Ost 1st Mrg lesion, 90 %stenosed.   VENTILATOR SETTINGS: None   Cultures None  Antimicrobials: None   Devices None   LINES / TUBES:  None    Continuous Infusions: . sodium chloride    . sodium chloride 10 mL/hr at 07/04/17 0600     Objective: Vitals:   07/03/17 2100 07/04/17 0103 07/04/17 0500  07/04/17 0844  BP: 96/81 96/68 102/69   Pulse: 89 90 89   Resp: 20 (!) 23 (!) 22   Temp: 97.6 F (36.4 C) 97.7 F (36.5 C) 97.6 F (36.4 C)   TempSrc: Oral Axillary Axillary   SpO2: 97% 97% 96% 96%  Weight:   196 lb 13.9 oz (89.3 kg)     Height:        Intake/Output Summary (Last 24 hours) at 07/04/17 0848 Last data filed at 07/04/17 0550  Gross per 24 hour  Intake             1040 ml  Output             3325 ml  Net            -2285 ml   Filed Weights   07/03/17 0413 07/03/17 1223 07/04/17 0500  Weight: 196 lb 10.4 oz (89.2 kg) 197 lb 5 oz (89.5 kg) 196 lb 13.9 oz (89.3 kg)    Examination:  General: A/O 4, No acute respiratory distress, positive chronic respiratory distress Eyes: negative scleral hemorrhage, negative anisocoria, negative icterus ENT: Negative Runny nose, negative gingival bleeding, Neck:  Negative scars, masses, torticollis, lymphadenopathy, JVD Lungs: Clear to auscultation bilaterally without wheezes or crackles Cardiovascular: Regular rate and rhythm without murmur gallop or rub normal S1 and S2 Abdomen: negative abdominal pain, nondistended, positive soft, bowel sounds, no rebound, no ascites, no appreciable mass Extremities: No significant cyanosis, clubbing. Bilateral lower extremity edema +1. LLE Charcot's foot Skin: Negative rashes, lesions, ulcers Psychiatric:  Negative depression, negative anxiety, negative fatigue, negative mania  Central nervous system:  Cranial nerves II through XII intact, tongue/uvula midline, all extremities muscle strength 5/5, sensation intact throughout, fnegative dysarthria, negative expressive aphasia, negative receptive aphasia.  .     Data Reviewed: Care during the described time interval was provided by me .  I have reviewed this patient's available data, including medical history, events of note, physical examination, and all test results as part of my evaluation. I have personally reviewed and interpreted all radiology studies.  CBC:  Recent Labs Lab 06/30/17 1315 07/04/17 0508  WBC 7.1 5.6  HGB 11.5* 10.3*  HCT 36.0* 32.7*  MCV 87.0 86.1  PLT 199 204   Basic Metabolic Panel:  Recent Labs Lab 06/30/17 1315 06/30/17 1942 07/01/17 0645  07/02/17 0247 07/03/17 0218 07/04/17 0343  NA 128*  --  133* 133* 131* 132*  K 4.4  --  4.2 3.7 3.8 3.4*  CL 93*  --  96* 98* 94* 95*  CO2 25  --  26 26 29 30   GLUCOSE 189*  --  131* 133* 194* 129*  BUN 27*  --  30* 29* 26* 24*  CREATININE 1.23 1.26* 1.26* 1.50* 1.42* 1.18  CALCIUM 8.4*  --  8.1* 8.2* 8.0* 8.0*  MG  --   --   --  2.0 2.2 2.1   GFR: Estimated Creatinine Clearance: 56.6 mL/min (by C-G formula based on SCr of 1.18 mg/dL). Liver Function Tests:  Recent Labs Lab 06/30/17 1942 07/02/17 0247  AST 20 20  ALT 119* 78*  ALKPHOS 93 78  BILITOT 1.2 0.7  PROT 6.2* 5.4*  ALBUMIN 2.9* 2.4*   No results for input(s): LIPASE, AMYLASE in the last 168 hours. No results for input(s): AMMONIA in the last 168 hours. Coagulation Profile:  Recent Labs Lab 07/04/17 0508  INR 1.30   Cardiac Enzymes:  Recent Labs Lab 06/30/17 1942 07/01/17 0135 07/01/17  0645  TROPONINI 0.04* 0.03* 0.05*   BNP (last 3 results) No results for input(s): PROBNP in the last 8760 hours. HbA1C:  Recent Labs  07/01/17 1442 07/02/17 0247  HGBA1C 8.7* 9.0*   CBG:  Recent Labs Lab 07/03/17 0729 07/03/17 1104 07/03/17 1629 07/03/17 2226 07/04/17 0719  GLUCAP 132* 171* 168* 177* 122*   Lipid Profile:  Recent Labs  07/01/17 1442 07/02/17 0247  CHOL 102 100  HDL 19* 21*  LDLCALC 66 68  TRIG 86 57  CHOLHDL 5.4 4.8   Thyroid Function Tests: No results for input(s): TSH, T4TOTAL, FREET4, T3FREE, THYROIDAB in the last 72 hours. Anemia Panel: No results for input(s): VITAMINB12, FOLATE, FERRITIN, TIBC, IRON, RETICCTPCT in the last 72 hours. Urine analysis: No results found for: COLORURINE, APPEARANCEUR, LABSPEC, PHURINE, GLUCOSEU, HGBUR, BILIRUBINUR, KETONESUR, PROTEINUR, UROBILINOGEN, NITRITE, LEUKOCYTESUR Sepsis Labs: @LABRCNTIP (procalcitonin:4,lacticidven:4)  ) Recent Results (from the past 240 hour(s))  Blood culture (routine x 2)     Status: None (Preliminary result)     Collection Time: 06/30/17  3:39 PM  Result Value Ref Range Status   Specimen Description BLOOD RIGHT HAND  Final   Special Requests   Final    BOTTLES DRAWN AEROBIC AND ANAEROBIC Blood Culture adequate volume   Culture NO GROWTH 3 DAYS  Final   Report Status PENDING  Incomplete  Culture, blood (Routine X 2) w Reflex to ID Panel     Status: None (Preliminary result)   Collection Time: 06/30/17  8:44 PM  Result Value Ref Range Status   Specimen Description BLOOD LEFT ANTECUBITAL  Final   Special Requests   Final    BOTTLES DRAWN AEROBIC AND ANAEROBIC Blood Culture adequate volume   Culture NO GROWTH 3 DAYS  Final   Report Status PENDING  Incomplete         Radiology Studies: No results found.      Scheduled Meds: . [MAR Hold] aspirin EC  81 mg Oral Daily  . [MAR Hold] carvedilol  3.125 mg Oral BID WC  . [MAR Hold] enoxaparin (LOVENOX) injection  40 mg Subcutaneous Q24H  . [MAR Hold] furosemide  80 mg Intravenous Q12H  . [MAR Hold] hydrocerin   Topical BID  . [MAR Hold] insulin aspart  0-5 Units Subcutaneous QHS  . [MAR Hold] insulin aspart  0-9 Units Subcutaneous TID WC  . [MAR Hold] insulin glargine  8 Units Subcutaneous QHS  . [MAR Hold] pantoprazole  40 mg Oral Daily  . sodium chloride flush  3 mL Intravenous Q12H  . [MAR Hold] tamsulosin  0.4 mg Oral Daily   Continuous Infusions: . sodium chloride    . sodium chloride 10 mL/hr at 07/04/17 0600     LOS: 4 days    Time spent: 40 minutes    Lakela Kuba, Roselind MessierURTIS J, MD [facility] Pager [phone]   If 7PM-7AM, please contact night-coverage [email or web address] Password [email or web address] 07/04/2017, 8:48 AM

## 2017-07-04 NOTE — Plan of Care (Signed)
Problem: Activity: Goal: Risk for activity intolerance will decrease Outcome: Progressing Able to participate in bed mobility  Problem: Fluid Volume: Goal: Ability to maintain a balanced intake and output will improve Outcome: Progressing Lower extremities swelling improving. Voided 1600 ml

## 2017-07-04 NOTE — Progress Notes (Signed)
Progress Note  Patient Name: Devin Centerheodore Peddy Jr. Date of Encounter: 07/04/2017  Primary Cardiologist: New    Subjective   Breathiing is OK at rest.  No CP    Inpatient Medications    Scheduled Meds: . [MAR Hold] aspirin EC  81 mg Oral Daily  . [MAR Hold] carvedilol  3.125 mg Oral BID WC  . [MAR Hold] enoxaparin (LOVENOX) injection  40 mg Subcutaneous Q24H  . [MAR Hold] furosemide  80 mg Intravenous Q12H  . [MAR Hold] hydrocerin   Topical BID  . [MAR Hold] insulin aspart  0-5 Units Subcutaneous QHS  . [MAR Hold] insulin aspart  0-9 Units Subcutaneous TID WC  . [MAR Hold] insulin glargine  8 Units Subcutaneous QHS  . [MAR Hold] pantoprazole  40 mg Oral Daily  . sodium chloride flush  3 mL Intravenous Q12H  . [MAR Hold] tamsulosin  0.4 mg Oral Daily   Continuous Infusions: . sodium chloride    . sodium chloride 10 mL/hr at 07/04/17 0600   PRN Meds: sodium chloride, [MAR Hold] acetaminophen, [MAR Hold] diphenhydrAMINE, [MAR Hold] ipratropium-albuterol, [MAR Hold] ondansetron (ZOFRAN) IV, sodium chloride flush   Vital Signs    Vitals:   07/03/17 1223 07/03/17 2100 07/04/17 0103 07/04/17 0500  BP:  96/81 96/68 102/69  Pulse:  89 90 89  Resp:  20 (!) 23 (!) 22  Temp:  97.6 F (36.4 C) 97.7 F (36.5 C) 97.6 F (36.4 C)  TempSrc:  Oral Axillary Axillary  SpO2:  97% 97% 96%  Weight: 197 lb 5 oz (89.5 kg)   196 lb 13.9 oz (89.3 kg)  Height:        Intake/Output Summary (Last 24 hours) at 07/04/17 0839 Last data filed at 07/04/17 0550  Gross per 24 hour  Intake             1040 ml  Output             3325 ml  Net            -2285 ml   Filed Weights   07/03/17 0413 07/03/17 1223 07/04/17 0500  Weight: 196 lb 10.4 oz (89.2 kg) 197 lb 5 oz (89.5 kg) 196 lb 13.9 oz (89.3 kg)    Telemetry    SR   - Personally Reviewed  ECG    Physical Exam   GEN: No acute distress.   Neck: No JVD Cardiac: RRR, no murmurs, rubs, or gallops.  Respiratory: Clear to auscultation  bilaterally. GI: Soft, nontender, non-distended  MS: No edema; No deformity. Neuro:  Nonfocal  Psych: Normal affect   Labs    Chemistry Recent Labs Lab 06/30/17 1942  07/02/17 0247 07/03/17 0218 07/04/17 0343  NA  --   < > 133* 131* 132*  K  --   < > 3.7 3.8 3.4*  CL  --   < > 98* 94* 95*  CO2  --   < > 26 29 30   GLUCOSE  --   < > 133* 194* 129*  BUN  --   < > 29* 26* 24*  CREATININE 1.26*  < > 1.50* 1.42* 1.18  CALCIUM  --   < > 8.2* 8.0* 8.0*  PROT 6.2*  --  5.4*  --   --   ALBUMIN 2.9*  --  2.4*  --   --   AST 20  --  20  --   --   ALT 119*  --  78*  --   --  ALKPHOS 93  --  78  --   --   BILITOT 1.2  --  0.7  --   --   GFRNONAA 54*  < > 44* 47* 59*  GFRAA >60  < > 51* 54* >60  ANIONGAP  --   < > 9 8 7   < > = values in this interval not displayed.   Hematology Recent Labs Lab 06/30/17 1315 07/04/17 0508  WBC 7.1 5.6  RBC 4.14* 3.80*  HGB 11.5* 10.3*  HCT 36.0* 32.7*  MCV 87.0 86.1  MCH 27.8 27.1  MCHC 31.9 31.5  RDW 16.0* 16.0*  PLT 199 204    Cardiac Enzymes Recent Labs Lab 06/30/17 1942 07/01/17 0135 07/01/17 0645  TROPONINI 0.04* 0.03* 0.05*    Recent Labs Lab 06/30/17 1327  TROPIPOC 0.02     BNP Recent Labs Lab 06/30/17 1315  BNP 2,137.1*     DDimer No results for input(s): DDIMER in the last 168 hours.   Radiology    No results found.  Cardiac Studies   Cath today    Patient Profile       Assessment & Plan    1  Acute on chronic systolic CHF   Severe LV dysfunction LVEF 15%    Plan for R and L heart catheteriztion today    Signed, Dietrich PatesPaula Tank Difiore, MD  07/04/2017, 8:39 AM

## 2017-07-04 NOTE — Progress Notes (Signed)
Responded to page and request for chaplain. Patient requesting prayer. Prayed with patient and offered ministry of presence.

## 2017-07-04 NOTE — Consult Note (Signed)
[address]       [place] [address]             [phone]        [facility] Record #[id number] Date of Birth: [birth year]  Referring: No ref. provider found Primary Care: Shelbie Ammons, MD  Chief Complaint:    Chief Complaint  Patient presents with  . Chest Pain    History of Present Illness:    The patient is a 76 year old male with a previous history of congestive heart failure and poorly controlled diabetes mellitus. He presented to the emergency department on 723 with a chief complaint of chest pain and shortness of breath. The patient was recently released by [facility] where he presented with chest pain and volume overload. Reportedly there is a difficulty in diuresis due to hypotension. After discharge he continued to have ongoing chest pain and shortness of breath and presented to the emergency department at [facility]. 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 He also has dementia and is a poor historian. Previous ejection fraction is 25%. He also has a history of elevated liver function studies felt to be from venous congestion. Findings on a co-at [facility] included global hypokinesis and grade 3 diastolic dysfunction. He also has a decubiti. He was for further evaluation and treatment to include cardiology consultation. Echocardiogram repeated here shows severe LV dysfunction with ejection fraction of 15% with increased filling pressures. He has been diuresed with mild response. He underwent left and right heart catheterization today and the results are noted below. He has severe multivessel coronary artery disease and we are asked to see in consultation for consideration of revascularization. His most recent hemoglobin A1c is 9.0 . Liver enzymes are elevated but showing an improving trend.    Current Activity/ Functional Status: dependent on full time  care   Zubrod Score: At the time of surgery this patient's most appropriate activity status/level should be described as: []     0    Normal activity, no symptoms []     1    Restricted in physical strenuous activity but ambulatory, able to do out light work []     2    Ambulatory and capable of self care, unable to do work activities, up and about                 more than 50%  Of the time                            []     3    Only limited self care, in bed greater than 50% of waking hours [x]     4    Completely disabled, no self care, confined to bed or chair []     5    Moribund  Past Medical History:  Diagnosis Date  . Charcot's joint of left ankle   . Diabetes mellitus without complication (HCC)   . Diabetic neuropathy (HCC)   . Nonpalpable pulse   . Peripheral angiopathy (HCC)     Past Surgical History:  Procedure Laterality Date  . RIGHT/LEFT HEART CATH AND CORONARY ANGIOGRAPHY N/A 07/04/2017   Procedure: Right/Left Heart Cath and Coronary Angiography;  Surgeon: [place], Romond Pipkins M, MD;  Location: [facility] INVASIVE CV LAB;  Service: Cardiovascular;  Laterality: N/A;    History  Smoking Status  .  Never Smoker  Smokeless Tobacco  . Never Used    History  Alcohol Use No    Social History   Social History  . Marital status: Single    Spouse name: N/A  . Number of children: N/A  . Years of education: N/A   Occupational History  . Not on file.   Social History Main Topics  . Smoking status: Never Smoker  . Smokeless tobacco: Never Used  . Alcohol use No  . Drug use: No  . Sexual activity: Not on file   Other Topics Concern  . Not on file   Social History Narrative  . No narrative on file    Allergies  Allergen Reactions  . Other     "TRANQUILIZERS" make him violent per Darla Allred POA "[facility]"  . Sulfa Antibiotics Other (See Comments)    Reaction unknown    Current Facility-Administered Medications    Medication Dose Route Frequency Provider Last Rate Last Dose  . 0.9 %  sodium chloride infusion  250 mL Intravenous PRN [place], Lorma Heater M, MD      . acetaminophen (TYLENOL) tablet 650 mg  650 mg Oral Q4H PRN Marlin Canary U, DO   650 mg at 07/03/17 1651  . aspirin EC tablet 81 mg  81 mg Oral Daily Marlin Canary U, DO   81 mg at 07/04/17 1114  . carvedilol (COREG) tablet 3.125 mg  3.125 mg Oral BID WC Pricilla Riffle, MD      . diphenhydrAMINE (BENADRYL) capsule 25 mg  25 mg Oral QHS PRN Leda Gauze, NP   25 mg at 07/02/17 2320  . [START ON 07/05/2017] enoxaparin (LOVENOX) injection 40 mg  40 mg Subcutaneous Q24H [place], Amazin Pincock M, MD      . furosemide (LASIX) injection 80 mg  80 mg Intravenous Q12H Pricilla Riffle, MD   80 mg at 07/04/17 1115  . hydrocerin (EUCERIN) cream   Topical BID Jetty Duhamel T, MD      . insulin aspart (novoLOG) injection 0-5 Units  0-5 Units Subcutaneous QHS Vann, Jessica U, DO      . insulin aspart (novoLOG) injection 0-9 Units  0-9 Units Subcutaneous TID WC Vann, Jessica U, DO   1 Units at 07/04/17 1200  . insulin glargine (LANTUS) injection 8 Units  8 Units Subcutaneous QHS Drema Dallas, MD   8 Units at 07/03/17 2215  . ipratropium-albuterol (DUONEB) 0.5-2.5 (3) MG/3ML nebulizer solution 3 mL  3 mL Nebulization Q4H PRN Drema Dallas, MD   3 mL at 07/01/17 2235  . ondansetron (ZOFRAN) injection 4 mg  4 mg Intravenous Q6H PRN Vann, Jessica U, DO      . pantoprazole (PROTONIX) EC tablet 40 mg  40 mg Oral Daily Vann, Jessica U, DO   40 mg at 07/04/17 1114  . sodium chloride flush (NS) 0.9 % injection 3 mL  3 mL Intravenous Q12H [place], Aubreyana Saltz M, MD      . sodium chloride flush (NS) 0.9 % injection 3 mL  3 mL Intravenous PRN [place], Haydon Dorris M, MD      . tamsulosin [facility]) capsule 0.4 mg  0.4 mg Oral Daily Marlin Canary U, DO   0.4 mg at 07/04/17 1114    Prescriptions Prior to Admission  Medication Sig Dispense Refill Last Dose  . acetaminophen (TYLENOL) 325 MG  tablet Take 650 mg by mouth every 6 (six) hours as  needed for mild pain.    Past Month at Unknown time  . aspirin 81 MG tablet Take 81 mg by mouth daily.   06/30/2017 at Unknown time  . carvedilol (COREG) 3.125 MG tablet Take 12.5 mg by mouth 2 (two) times daily with a meal.   06/30/2017 at am  . furosemide (LASIX) 20 MG tablet Take 20 mg by mouth 2 (two) times daily.    06/30/2017 at am  . glipiZIDE (GLUCOTROL) 5 MG tablet Take 5 mg by mouth daily before breakfast.   06/30/2017 at Unknown time  . lisinopril (PRINIVIL,ZESTRIL) 2.5 MG tablet Take 2.5 mg by mouth daily.   06/30/2017 at Unknown time  . omeprazole (PRILOSEC) 20 MG capsule Take 20 mg by mouth daily.   06/30/2017 at Unknown time  . polyethylene glycol powder (MIRALAX) powder Take 17 g by mouth daily as needed for mild constipation.    Past Week at Unknown time  . potassium chloride SA (K-DUR,KLOR-CON) 20 MEQ tablet Take 20 mEq by mouth daily.   06/30/2017 at Unknown time  . spironolactone (ALDACTONE) 25 MG tablet Take 12.5 mg by mouth 2 (two) times daily.    06/30/2017 at Unknown time  . traMADol (ULTRAM) 50 MG tablet Take 50 mg by mouth daily.   06/30/2017 at Unknown time    History reviewed. No pertinent family history.   Review of Systems: poor historian , unclear if answers are correct- he rambles at times , speaks "in tongues" at times      Cardiac Review of Systems: Y or N  Chest Pain [ y   ]  Resting SOB [ y  ] Exertional SOB  Patrice.Shin  ]  Orthopnea [place].Etienne  ]   Pedal Edema [ y  ]    Palpitations [  ] Syncope  [  ]   Presyncope [   ]  General Review of Systems: [Y] = yes [  ]=no Constitional: recent weight change [  ]; anorexia [  ]; fatigue [  ]; nausea [  ]; night sweats [  ]; fever [  ]; or chills [  ]                                                               Dental: poor dentition[  ]; Last Dentist visit: edentulous  Eye : blurred vision [  ]; diplopia [   ]; vision changes [  ];  Amaurosis fugax[  ]; Resp: cough [  ];  wheezing[  ];   hemoptysis[  ]; shortness of breath[  ]; paroxysmal nocturnal dyspnea[  ]; dyspnea on exertion[  ]; or orthopnea[  ];  GI:  gallstones[  ], vomiting[  ];  dysphagia[  ]; melena[  ];  hematochezia [  ]; heartburn[  ];   Hx of  Colonoscopy[  ]; GU: kidney stones [  ]; hematuria[  ];   dysuria [  ];  nocturia[  ];  history of     obstruction [  ]; urinary frequency [  ]             Skin: rash, swelling[  ];, hair loss[  ];  peripheral edema[  ];  or itching[  ]; Musculosketetal: myalgias[  ];  joint swelling[  ];  joint erythema[  ];  joint pain[  ];  back pain[  ];  Heme/Lymph: bruising[  ];  bleeding[  ];  anemia[  ];  Neuro: TIA[  ];  headaches[  ];  stroke[  ];  vertigo[  ];  seizures[  ];   paresthesias[  ];  difficulty walking[  ];  Psych:depression[  ]; anxiety[  ];  Endocrine: diabetes[ y ];  thyroid dysfunction[  ];  Immunizations: Flu [  ]; Pneumococcal[  ];  Other:  Physical Exam: BP 99/71   Pulse 93   Temp (!) 97.5 F (36.4 C) (Oral)   Resp 15   Ht 5\' 6"  (1.676 m)   Wt 196 lb 13.9 oz (89.3 kg)   SpO2 98%   BMI 31.78 kg/m    General appearance: alert, cooperative, distracted and no distress Head: Normocephalic, without obvious abnormality, atraumatic Neck: no adenopathy, no carotid bruit, no JVD, supple, symmetrical, trachea midline and thyroid not enlarged, symmetric, no tenderness/mass/nodules Lymph nodes: Cervical, supraclavicular, and axillary nodes normal. Resp: coarse BS, deep breaths elicit cough Back: symmetric, no curvature. ROM normal. No CVA tenderness. Cardio: regular rate and rhythm, S1, S2 normal, no murmur, click, rub or gallop GI: soft, nontender, + peri-umbilical hernia Extremities: ulcerations on both feet, both feet contracted, no pedal pulses palpable Neurologic: Mental status: confused and not oriented.  Reportedly has a sacral decub but he would not let me examine at this time  Diagnostic Studies & Laboratory data:     Recent Radiology Findings:     No results found.   I have independently reviewed the above radiologic studies.  Recent Lab Findings: Lab Results  Component Value Date   WBC 5.6 07/04/2017   HGB 10.3 (L) 07/04/2017   HCT 32.7 (L) 07/04/2017   PLT 204 07/04/2017   GLUCOSE 129 (H) 07/04/2017   CHOL 100 07/02/2017   TRIG 57 07/02/2017   HDL 21 (L) 07/02/2017   LDLCALC 68 07/02/2017   ALT 78 (H) 07/02/2017   AST 20 07/02/2017   NA 132 (L) 07/04/2017   K 3.4 (L) 07/04/2017   CL 95 (L) 07/04/2017   CREATININE 1.18 07/04/2017   BUN 24 (H) 07/04/2017   CO2 30 07/04/2017   INR 1.30 07/04/2017   HGBA1C 9.0 (H) 07/02/2017    [place], Jule Whitsel M, MD (Primary)    Procedures   Right/Left Heart Cath and Coronary Angiography  Conclusion     Mid RCA lesion, 90 %stenosed.  Acute Mrg lesion, 90 %stenosed.  Ost LM to LM lesion, 45 %stenosed.  Ost LAD lesion, 70 %stenosed.  Ost LAD to Prox LAD lesion, 95 %stenosed.  1st Diag lesion, 90 %stenosed.  Ost 2nd Diag to 2nd Diag lesion, 90 %stenosed.  Prox LAD to Mid LAD lesion, 85 %stenosed.  Ost Cx lesion, 99 %stenosed.  Ost 1st Mrg lesion, 90 %stenosed.  LV end diastolic pressure is mildly elevated.  Hemodynamic findings consistent with mild pulmonary hypertension.   1. Critical 3 vessel obstructive CAD.  2. Mild pulmonary HTN 3. Mildly elevated LV filling pressures  Plan: recommend CT surgery consult for CABG. Patient is not a candidate for percutaneous revascularization.   Indications   Acute combined systolic and diastolic heart failure (HCC) [I50.41 (ICD-10-CM)]  Procedural Details/Technique   Technical Details Indication: 76 yo WM presents with acute systolic CHF. EF 15%  Procedural Details: The right wrist was prepped, draped, and anesthetized with 1% lidocaine. Using the modified Seldinger technique a 6 Fr slender sheath was  placed in the right radial artery and a 5 French sheath was placed in the right brachial vein. A Swan-Ganz catheter  was used for the right heart catheterization. Standard protocol was followed for recording of right heart pressures and sampling of oxygen saturations. Fick cardiac output was calculated. Standard Judkins catheters were used for selective coronary angiography and left ventricular pressures. There were no immediate procedural complications. The patient was transferred to the post catheterization recovery area for further monitoring. Contrast: 60 cc   Estimated blood loss <50 mL.  During this procedure the patient was administered the following to achieve and maintain moderate conscious sedation: Versed 1 mg, Fentanyl 25 mcg, while the patient's heart rate, blood pressure, and oxygen saturation were continuously monitored. The period of conscious sedation was 25 minutes, of which I was present face-to-face 100% of this time.    Complications   Complications documented before study signed (07/04/2017 9:44 AM EDT)    No complications were associated with this study.  Documented by [place], Allix Blomquist M, MD - 07/04/2017 9:29 AM EDT    Coronary Findings   Dominance: Co-dominant  Left Main  Ost LM to LM lesion, 45% stenosed. The lesion is severely calcified.  Left Anterior Descending  Ost LAD lesion, 70% stenosed.  Ost LAD to Prox LAD lesion, 95% stenosed. The lesion is severely calcified.  Prox LAD to Mid LAD lesion, 85% stenosed.  First Diagonal Branch  Vessel is small in size.  1st Diag lesion, 90% stenosed.  Second Diagonal [employer] 2nd Diag to 2nd Diag lesion, 90% stenosed. The lesion is segmental. The lesion is severely calcified.  Left Circumflex  Ost Cx lesion, 99% stenosed. The lesion is severely calcified.  First Obtuse Marginal Branch  Ost 1st Mrg lesion, 90% stenosed.  Right Coronary Artery  Mid RCA lesion, 90% stenosed.  Acute Marginal Branch  Acute Mrg lesion, 90% stenosed.  Right Heart   Right Heart Pressures Hemodynamic findings consistent with mild pulmonary hypertension.  Elevated LV EDP consistent with volume overload.    Left Heart   Left Ventricle LV end diastolic pressure is mildly elevated.    Coronary Diagrams   Diagnostic Diagram       Implants     No implant documentation for this case.  PACS Images   Show images for Cardiac catheterization   Link to Procedure Log   Procedure Log    Hemo Data    Most Recent Value  Fick Cardiac Output 4.29 L/min  Fick Cardiac Output Index 2.16 (L/min)/BSA  RA A Wave 15 mmHg  RA V Wave 12 mmHg  RA Mean 10 mmHg  RV Systolic Pressure 50 mmHg  RV Diastolic Pressure 4 mmHg  RV EDP 13 mmHg  PA Systolic Pressure 48 mmHg  PA Diastolic Pressure 21 mmHg  PA Mean 30 mmHg  PW A Wave 24 mmHg  PW V Wave 27 mmHg  PW Mean 24 mmHg  AO Systolic Pressure 94 mmHg  AO Diastolic Pressure 63 mmHg  AO Mean 79 mmHg  LV Systolic Pressure 95 mmHg  LV Diastolic Pressure 13 mmHg  LV EDP 0 mmHg  Arterial Occlusion Pressure Extended Systolic Pressure 93 mmHg  Arterial Occlusion Pressure Extended Diastolic Pressure 53 mmHg  Arterial Occlusion Pressure Extended Mean Pressure 71 mmHg  Left Ventricular Apex Extended Systolic Pressure 93 mmHg  Left Ventricular Apex Extended Diastolic Pressure 11 mmHg  QP/QS 1  TPVR Index 13.9 HRUI  TSVR Index 36.61 HRUI  PVR SVR Ratio 0.09  TPVR/TSVR Ratio 0.38  Order-Level Documents:   There are no order-level documents.  Encounter-Level Documents - 06/30/2017:   Scan on 07/04/2017 9:24 AM by Default, Provider, MD  Scan on 07/04/2017 1:27 PM by Default, Provider, MD  Document on 07/01/2017 6:05 PM by Azalia Bilisampos, Kevin, MD : ED PB Summary  Scan on 07/01/2017 1:14 PM by Default, Provider, MD  Scan on 07/04/2017 9:09 AM by Default, Provider, MD  Electronic signature on 06/30/2017 1:58 PM  Electronic signature on 06/30/2017 1:58 PM  Scan on 06/30/2017 1:54 PM by Default, Provider, MD      Signed   Electronically signed by [place], Waylin Dorko M, MD on 07/04/17 at [phone] EDT    Result status:  Final result                              *[place]*                   *[facility]                         [address]                        [place], [place] [address]                            [phone]  ------------------------------------------------------------------- Transthoracic Echocardiography  Patient:    Devin Merritt, Devin Merritt MR #:       [id number] Study Date: 07/02/2017 Gender:     M Age:        75 Height:     167.6 cm Weight:     88.6 kg BSA:        2.06 m^2 Pt. Status: Room:       [id number]   Celine AhrDERING     Paula Ross, M.D.  REFERRING    Dietrich PatesPaula Ross, M.D.  ATTENDING    Azalia BilisCampos, Kevin [phone]  SONOGRAPHER  Jeryl ColumbiaJohanna Elliott  ADMITTING    Marlin CanaryVann, Jessica U  PERFORMING   [facility], Inpatient  cc:  ------------------------------------------------------------------- LV EF: 15%  ------------------------------------------------------------------- Indications:      Chest pain 786.51.  ------------------------------------------------------------------- History:   PMH:   Congestive heart failure.  Risk factors: Diabetes mellitus.  ------------------------------------------------------------------- Study Conclusions  - Left ventricle: The cavity size was moderately dilated. Systolic   function was normal. The estimated ejection fraction was 15%.   Severe diffuse hypokinesis with regional variations. There was a   reduced contribution of atrial contraction to ventricular   filling, due to increased ventricular diastolic pressure or   atrial contractile dysfunction. Doppler parameters are consistent   with a reversible restrictive pattern, indicative of decreased   left ventricular diastolic compliance and/or increased left   atrial pressure (grade 3 diastolic dysfunction). Doppler   parameters are consistent with high ventricular filling pressure. - Mitral valve: Calcified annulus. Mild diffuse calcification of   the anterior leaflet. There was  mild regurgitation. Valve area by   pressure half-time: 1.36 cm^2. - Left atrium: The atrium was mildly dilated. Anterior-posterior   dimension: 41 mm. - Pulmonary arteries: PA peak pressure: 37 mm Hg (S). - Recommendations: Images insufficient to rule out apical thrombus.   Recommend limited study with definity contrast.  Impressions:  - The right ventricular systolic pressure was increased consistent   with mild pulmonary  hypertension.  Recommendations:  Images insufficient to rule out apical thrombus. Recommend limited study with definity contrast.  ------------------------------------------------------------------- Study data:  No prior study was available for comparison.  Study status:  Routine.  Procedure:  Transthoracic echocardiography. Image quality was adequate.          Transthoracic echocardiography.  M-mode, complete 2D, spectral Doppler, and color Doppler.  Birthdate:  Patient birthdate: 1941/12/08.  Age:  Patient is 76 yr old.  Sex:  Gender: male.    BMI: 31.5 kg/m^2.  Blood pressure:     111/75  Patient status:  Inpatient.  Study date: Study date: 07/02/2017. Study time: 11:19 AM.  Location:  Bedside.   -------------------------------------------------------------------  ------------------------------------------------------------------- Left ventricle:  The cavity size was moderately dilated. Systolic function was normal. The estimated ejection fraction was 15%. Severe diffuse hypokinesis with regional variations. There was a reduced contribution of atrial contraction to ventricular filling, due to increased ventricular diastolic pressure or atrial contractile dysfunction. Doppler parameters are consistent with a reversible restrictive pattern, indicative of decreased left ventricular diastolic compliance and/or increased left atrial pressure (grade 3 diastolic dysfunction). Doppler parameters are consistent with high ventricular filling  pressure.  ------------------------------------------------------------------- Aortic valve:   Trileaflet; moderately thickened, severely calcified leaflets. Mobility was not restricted.  Doppler: Transvalvular velocity was within the normal range. There was no stenosis. There was no regurgitation.  ------------------------------------------------------------------- Aorta:  Aortic root: The aortic root was normal in size.  ------------------------------------------------------------------- Mitral valve:   Calcified annulus.  Mild diffuse calcification of the anterior leaflet. Mobility was not restricted.  Doppler: Transvalvular velocity was within the normal range. There was no evidence for stenosis. There was mild regurgitation.    Valve area by pressure half-time: 1.36 cm^2. Indexed valve area by pressure half-time: 0.66 cm^2/m^2.    Peak gradient (D): 3 mm Hg.  ------------------------------------------------------------------- Left atrium:  The atrium was mildly dilated.  ------------------------------------------------------------------- Right ventricle:  The cavity size was normal. Wall thickness was normal. Systolic function was normal.  ------------------------------------------------------------------- Pulmonic valve:    Structurally normal valve.   Cusp separation was normal.  Doppler:  Transvalvular velocity was within the normal range. There was no evidence for stenosis. There was no regurgitation.  ------------------------------------------------------------------- Tricuspid valve:   Structurally normal valve.    Doppler: Transvalvular velocity was within the normal range. There was mild regurgitation.  ------------------------------------------------------------------- Pulmonary artery:   The main pulmonary artery was normal-sized. Systolic pressure was within the normal range.  ------------------------------------------------------------------- Right  atrium:  The atrium was normal in size.  ------------------------------------------------------------------- Pericardium:  There was no pericardial effusion.  ------------------------------------------------------------------- Systemic veins: Inferior vena cava: The vessel was dilated. The respirophasic diameter changes were blunted (< 50%), consistent with elevated central venous pressure.  ------------------------------------------------------------------- Measurements   Left ventricle                         Value          Reference  LV ID, ED, PLAX chordal        (H)     58    mm       43 - 52  LV ID, ES, PLAX chordal        (H)     54    mm       23 - 38  LV fx shortening, PLAX chordal (L)     7     %        >=29  LV PW  thickness, ED                    10    mm       ----------  IVS/LV PW ratio, ED                    1.1            <=1.3  LV e&', lateral                         5.96  cm/s     ----------  LV E/e&', lateral                       14.5           ----------  LV e&', medial                          3.55  cm/s     ----------  LV E/e&', medial                        24.34          ----------  LV e&', average                         4.76  cm/s     ----------  LV E/e&', average                       18.17          ----------    Ventricular septum                     Value          Reference  IVS thickness, ED                      11    mm       ----------    LVOT                                   Value          Reference  LVOT ID, S                             20    mm       ----------  LVOT area                              3.14  cm^2     ----------    Aorta                                  Value          Reference  Aortic root ID, ED                     32    mm       ----------    Left atrium  Value          Reference  LA ID, A-P, ES                         41    mm       ----------  LA ID/bsa, A-P                         1.99   cm/m^2   <=2.2  LA volume, S                           56.5  ml       ----------  LA volume/bsa, S                       27.4  ml/m^2   ----------  LA volume, ES, 1-p A4C                 68.8  ml       ----------  LA volume/bsa, ES, 1-p A4C             33.4  ml/m^2   ----------  LA volume, ES, 1-p A2C                 41.7  ml       ----------  LA volume/bsa, ES, 1-p A2C             20.2  ml/m^2   ----------    Mitral valve                           Value          Reference  Mitral E-wave peak velocity            86.4  cm/s     ----------  Mitral A-wave peak velocity            21.9  cm/s     ----------  Mitral deceleration time               169   ms       150 - 230  Mitral pressure half-time              162   ms       ----------  Mitral peak gradient, D                3     mm Hg    ----------  Mitral E/A ratio, peak                 3.9            ----------  Mitral valve area, PHT, DP             1.36  cm^2     ----------  Mitral valve area/bsa, PHT, DP         0.66  cm^2/m^2 ----------    Pulmonary arteries                     Value          Reference  PA pressure, S, DP             (H)     37    mm Hg    <=30  Tricuspid valve                        Value          Reference  Tricuspid regurg peak velocity         237   cm/s     ----------  Tricuspid peak RV-RA gradient          22    mm Hg    ----------    Right atrium                           Value          Reference  RA ID, S-I, ES, A4C                    45.1  mm       34 - 49  RA area, ES, A4C                       15    cm^2     8.3 - 19.5  RA volume, ES, A/L                     42.3  ml       ----------  RA volume/bsa, ES, A/L                 20.5  ml/m^2   ----------    Systemic veins                         Value          Reference  Estimated CVP                          15    mm Hg    ----------    Right ventricle                        Value          Reference  TAPSE                                  9.66  mm        ----------  RV pressure, S, DP             (H)     37    mm Hg    <=30  Legend: (L)  and  (H)  mark values outside specified reference range.  ------------------------------------------------------------------- Prepared and Electronically Authenticated by  Armanda Magic, MD 2018-07-25T13:34:23  PACS Images   Show images for ECHOCARDIOGRAM COMPLETE  Patient Information   Patient Name Rhet, Rorke. Sex Male DOB 09-23-1941 SSN [id number]  Reason For Exam  Priority: Routine  Not on file  Surgical History   Surgical History   No past medical history on file.    Other Surgical History   Procedure Laterality Date Comment Source  RIGHT/LEFT HEART CATH AND CORONARY ANGIOGRAPHY N/A 07/04/2017 Procedure: Right/Left Heart Cath and Coronary Angiography; Surgeon: [place], [place] Agostinelli M, MD; Location: [facility] INVASIVE CV LAB; Service: Cardiovascular; Laterality: N/A; Provider    Patient Data   Height 66 in    BP 94/69 mmHg  Performing Technologist/Nurse   Performing Technologist/Nurse: Jeryl Columbia R                    Implants     No active implants to display in this view.  Order-Level Documents:   There are no order-level documents.  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 He has significant physical impairments and dementia that  may prove to be a significant barrier to recovering from CABG. He also has multiple medical comorbidities that are poorly controlled including poorly controlled diabetes. He also has some healing ulcerations on his lower extremities as well as an early decubiti in his sacral region. He is not ambulatory and hasn't been for 30 years. He does move around his house on his knees spending most of this time in a chair. His niecewho is power of attorney understands that his ability to recover from the surgery would be extremely difficult. We could consider an MRI viability study to see how much ischemia is reversible.     I  spent 40 minutes counseling the patient face to face and 50% or more the  time was spent in counseling and coordination of care. The total time spent in the appointment was 40 minutes.    Rowe Clack, PA-C 07/04/2017 3:26 PM  Coronary arteriograms reviewed personally agree with above assessment Patient is not a candidate for CABG Rec medical therapy of heart failure Do not recommend viability study P Donata Clay MD

## 2017-07-04 NOTE — H&P (View-Only) (Signed)
Progress Note  Patient Name: Devin Centerheodore Peddy Jr. Date of Encounter: 07/04/2017  Primary Cardiologist: New    Subjective   Breathiing is OK at rest.  No CP    Inpatient Medications    Scheduled Meds: . [MAR Hold] aspirin EC  81 mg Oral Daily  . [MAR Hold] carvedilol  3.125 mg Oral BID WC  . [MAR Hold] enoxaparin (LOVENOX) injection  40 mg Subcutaneous Q24H  . [MAR Hold] furosemide  80 mg Intravenous Q12H  . [MAR Hold] hydrocerin   Topical BID  . [MAR Hold] insulin aspart  0-5 Units Subcutaneous QHS  . [MAR Hold] insulin aspart  0-9 Units Subcutaneous TID WC  . [MAR Hold] insulin glargine  8 Units Subcutaneous QHS  . [MAR Hold] pantoprazole  40 mg Oral Daily  . sodium chloride flush  3 mL Intravenous Q12H  . [MAR Hold] tamsulosin  0.4 mg Oral Daily   Continuous Infusions: . sodium chloride    . sodium chloride 10 mL/hr at 07/04/17 0600   PRN Meds: sodium chloride, [MAR Hold] acetaminophen, [MAR Hold] diphenhydrAMINE, [MAR Hold] ipratropium-albuterol, [MAR Hold] ondansetron (ZOFRAN) IV, sodium chloride flush   Vital Signs    Vitals:   07/03/17 1223 07/03/17 2100 07/04/17 0103 07/04/17 0500  BP:  96/81 96/68 102/69  Pulse:  89 90 89  Resp:  20 (!) 23 (!) 22  Temp:  97.6 F (36.4 C) 97.7 F (36.5 C) 97.6 F (36.4 C)  TempSrc:  Oral Axillary Axillary  SpO2:  97% 97% 96%  Weight: 197 lb 5 oz (89.5 kg)   196 lb 13.9 oz (89.3 kg)  Height:        Intake/Output Summary (Last 24 hours) at 07/04/17 0839 Last data filed at 07/04/17 0550  Gross per 24 hour  Intake             1040 ml  Output             3325 ml  Net            -2285 ml   Filed Weights   07/03/17 0413 07/03/17 1223 07/04/17 0500  Weight: 196 lb 10.4 oz (89.2 kg) 197 lb 5 oz (89.5 kg) 196 lb 13.9 oz (89.3 kg)    Telemetry    SR   - Personally Reviewed  ECG    Physical Exam   GEN: No acute distress.   Neck: No JVD Cardiac: RRR, no murmurs, rubs, or gallops.  Respiratory: Clear to auscultation  bilaterally. GI: Soft, nontender, non-distended  MS: No edema; No deformity. Neuro:  Nonfocal  Psych: Normal affect   Labs    Chemistry Recent Labs Lab 06/30/17 1942  07/02/17 0247 07/03/17 0218 07/04/17 0343  NA  --   < > 133* 131* 132*  K  --   < > 3.7 3.8 3.4*  CL  --   < > 98* 94* 95*  CO2  --   < > 26 29 30   GLUCOSE  --   < > 133* 194* 129*  BUN  --   < > 29* 26* 24*  CREATININE 1.26*  < > 1.50* 1.42* 1.18  CALCIUM  --   < > 8.2* 8.0* 8.0*  PROT 6.2*  --  5.4*  --   --   ALBUMIN 2.9*  --  2.4*  --   --   AST 20  --  20  --   --   ALT 119*  --  78*  --   --  ALKPHOS 93  --  78  --   --   BILITOT 1.2  --  0.7  --   --   GFRNONAA 54*  < > 44* 47* 59*  GFRAA >60  < > 51* 54* >60  ANIONGAP  --   < > 9 8 7  < > = values in this interval not displayed.   Hematology Recent Labs Lab 06/30/17 1315 07/04/17 0508  WBC 7.1 5.6  RBC 4.14* 3.80*  HGB 11.5* 10.3*  HCT 36.0* 32.7*  MCV 87.0 86.1  MCH 27.8 27.1  MCHC 31.9 31.5  RDW 16.0* 16.0*  PLT 199 204    Cardiac Enzymes Recent Labs Lab 06/30/17 1942 07/01/17 0135 07/01/17 0645  TROPONINI 0.04* 0.03* 0.05*    Recent Labs Lab 06/30/17 1327  TROPIPOC 0.02     BNP Recent Labs Lab 06/30/17 1315  BNP 2,137.1*     DDimer No results for input(s): DDIMER in the last 168 hours.   Radiology    No results found.  Cardiac Studies   Cath today    Patient Profile       Assessment & Plan    1  Acute on chronic systolic CHF   Severe LV dysfunction LVEF 15%    Plan for R and L heart catheteriztion today    Signed, Ataya Murdy, MD  07/04/2017, 8:39 AM    

## 2017-07-04 NOTE — Interval H&P Note (Signed)
History and Physical Interval Note:  07/04/2017 8:44 AM  Devin Centerheodore Bonebrake Jr.  has presented today for surgery, with the diagnosis of hf  The various methods of treatment have been discussed with the patient and family. After consideration of risks, benefits and other options for treatment, the patient has consented to  Procedure(s): Right/Left Heart Cath and Coronary Angiography (N/A) as a surgical intervention .  The patient's history has been reviewed, patient examined, no change in status, stable for surgery.  I have reviewed the patient's chart and labs.  Questions were answered to the patient's satisfaction.   Cath Lab Visit (complete for each Cath Lab visit)  Clinical Evaluation Leading to the Procedure:   ACS: No.  Non-ACS:    Anginal Classification: CCS III  Anti-ischemic medical therapy: Minimal Therapy (1 class of medications)  Non-Invasive Test Results: No non-invasive testing performed  Prior CABG: No previous CABG        Theron Aristaeter [facility]MD,FACC 07/04/2017 8:44 AM

## 2017-07-05 ENCOUNTER — Inpatient Hospital Stay (HOSPITAL_COMMUNITY): Payer: PPO

## 2017-07-05 LAB — BASIC METABOLIC PANEL
Anion gap: 9 (ref 5–15)
BUN: 19 mg/dL (ref 6–20)
CHLORIDE: 96 mmol/L — AB (ref 101–111)
CO2: 28 mmol/L (ref 22–32)
CREATININE: 1.11 mg/dL (ref 0.61–1.24)
Calcium: 7.9 mg/dL — ABNORMAL LOW (ref 8.9–10.3)
Glucose, Bld: 108 mg/dL — ABNORMAL HIGH (ref 65–99)
Potassium: 3.6 mmol/L (ref 3.5–5.1)
SODIUM: 133 mmol/L — AB (ref 135–145)

## 2017-07-05 LAB — CULTURE, BLOOD (ROUTINE X 2)
CULTURE: NO GROWTH
Culture: NO GROWTH
SPECIAL REQUESTS: ADEQUATE
Special Requests: ADEQUATE

## 2017-07-05 LAB — GLUCOSE, CAPILLARY
GLUCOSE-CAPILLARY: 203 mg/dL — AB (ref 65–99)
GLUCOSE-CAPILLARY: 160 mg/dL — AB (ref 65–99)
Glucose-Capillary: 148 mg/dL — ABNORMAL HIGH (ref 65–99)
Glucose-Capillary: 147 mg/dL — ABNORMAL HIGH (ref 65–99)

## 2017-07-05 LAB — MAGNESIUM: MAGNESIUM: 2.1 mg/dL (ref 1.7–2.4)

## 2017-07-05 MED ORDER — BISACODYL 10 MG RE SUPP
10.0000 mg | Freq: Once | RECTAL | Status: AC
  Administered 2017-07-05: 10 mg via RECTAL
  Filled 2017-07-05: qty 1

## 2017-07-05 MED ORDER — POTASSIUM CHLORIDE CRYS ER 20 MEQ PO TBCR
50.0000 meq | EXTENDED_RELEASE_TABLET | Freq: Once | ORAL | Status: AC
  Administered 2017-07-05: 17:00:00 50 meq via ORAL
  Filled 2017-07-05: qty 1

## 2017-07-05 MED ORDER — INSULIN GLARGINE 100 UNIT/ML ~~LOC~~ SOLN
12.0000 [IU] | Freq: Every day | SUBCUTANEOUS | Status: DC
  Administered 2017-07-05: 12 [IU] via SUBCUTANEOUS
  Filled 2017-07-05 (×3): qty 0.12

## 2017-07-05 NOTE — Progress Notes (Signed)
PROGRESS NOTE    Devin Centerheodore Dorsainvil Jr.  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   [facility] for elevated LFTs. There were thought to be elevated from venous congestion. HE had an echo there that showed EF 25%, global hypokinesis and grade 3 diastolic dsfn. Family states patient is weight at physicians office~200 pounds. Family has notice increased swelling in his LE. Patient is a poor historian and unable to provide information other than his "butt hurts". Family states he has been unbale to lay flat and coughing more than usual.    Subjective: 7/28  A/O  2 (does not know where, when), negative CP, positive acute on chronic SOB. Patient distraught over the fact that cardiothoracic surgery most likely will not perform CABG.       Assessment & Plan:   Active Problems:   CHF (congestive heart failure) (HCC)   Hyponatremia   Transaminitis   Acute systolic CHF (congestive heart failure) (HCC)   Diabetes (HCC)   Decubitus ulcer   Acute on chronic combined systolic and diastolic CHF (congestive heart failure) (HCC)   Acute systolic and diastolic CHF with b/l pleural effusions -Diuresis per cardiology gentle diuresis -Coreg 3.125 mg BID -Lasix 80 mg BID -Strict I&O since admission -  7.2  L -Daily weight Filed Weights   07/03/17 1223 07/04/17 0500 07/05/17 0356  Weight: 197 lb 5 oz (89.5 kg) 196 lb 13.9 oz (89.3 kg) 196 lb 11.9 oz (89.2 kg)  -7/27 S/P Right/Left heart catheterization, severe disease not amenable to conservative management. See results below -Patient being evaluated for CABG. Given his multiple co-morbidities poor candidate. Await final recommendations from cardiothoracic surgery  Respiratory  Alkalosis .-Resolved  Aspiration -7/28 patient passed swallow study: On dysphagia 2 diet with thin.  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 -7/25 Hemoglobin A1c= 9.0 -Lipid panel within ADA guidelines -7/28 Increase Lantus 12 units daily -Sensitive SSI   Dementia -seems to be at baseline -lives with niece who takes care of him and acts as his HCPOA  Hypokalemia -Potassium goal> 4 -K-Dur 50 mEq   Multiple scrotal lesions -Barrier cream applied    DVT prophylaxis: Lovenox Code Status: Full Family Communication:  none Disposition Plan: per cardiology   Consultants:  Cardiology Cardiothoracic surgery    Procedures/Significant Events:  PCXR on 7/25: Worsening interstitial edema/ worsening left retrocardiac consolidation/atelectasis 7/27 Right/Left Heart cath:,-Mid RCA lesion, 90 %stenosed.-Acute Mrg lesion, 90 %stenosed.Suezanne Jacquet- -Ost LAD lesion, 70 %stenosed.Suezanne Jacquet-Ost LAD to Prox LAD lesion, 95 %stenosed. -1st Diag lesion, 90 %stenosed.Suezanne Jacquet-Ost 2nd Diag to 2nd Diag lesion, 90 %stenosed. -Prox LAD to Mid LAD lesion, 85 %stenosed.-Ost Cx lesion, 99 %stenosed. -Ost 1st Mrg lesion, 90 %stenosed.   VENTILATOR SETTINGS: None   Cultures None  Antimicrobials: None   Devices None      LINES / TUBES:  None   Continuous Infusions: . sodium chloride       Objective: Vitals:   07/05/17 0005 07/05/17 0356 07/05/17 1256 07/05/17 1648  BP: (!) 89/61 (!) 88/66 109/66 98/66  Pulse: 85 88  (!) 105  Resp: 18 (!) 23  (!) 26  Temp: 98.7 F (37.1 C) 97.7 F (  36.5 C) 98.4 F (36.9 C) 97.9 F (36.6 C)  TempSrc: Oral Oral Oral Oral  SpO2: 99% 96% 100% 100%  Weight:  196 lb 11.9 oz (89.2 kg)    Height:        Intake/Output Summary (Last 24 hours) at 07/05/17 1843 Last data filed at 07/05/17 1400   Gross per 24 hour  Intake                0 ml  Output              775 ml  Net             -775 ml   Filed Weights   07/03/17 1223 07/04/17 0500 07/05/17 0356  Weight: 197 lb 5 oz (89.5 kg) 196 lb 13.9 oz (89.3 kg) 196 lb 11.9 oz (89.2 kg)    Examination:  General: A/O 4, No acute respiratory distress, positive chronic respiratory distress Eyes: negative scleral hemorrhage, negative anisocoria, negative icterus ENT: Negative Runny nose, negative gingival bleeding, Neck:  Negative scars, masses, torticollis, lymphadenopathy, JVD Lungs: Clear to auscultation bilaterally without wheezes or crackles Cardiovascular: Regular rate and rhythm without murmur gallop or rub normal S1 and S2 Abdomen: negative abdominal pain, nondistended, positive soft, bowel sounds, no rebound, no ascites, no appreciable mass Extremities: No significant cyanosis, clubbing. Bilateral lower extremity edema +1. LLE Charcot's foot Skin: Negative rashes, lesions, ulcers Psychiatric:  Negative depression, negative anxiety, negative fatigue, negative mania  Central nervous system:  Cranial nerves II through XII intact, tongue/uvula midline, all extremities muscle strength 5/5, sensation intact throughout, fnegative dysarthria, negative expressive aphasia, negative receptive aphasia. .     Data Reviewed: Care during the described time interval was provided by me .  I have reviewed this patient's available data, including medical history, events of note, physical examination, and all test results as part of my evaluation. I have personally reviewed and interpreted all radiology studies.  CBC:  Recent Labs Lab 06/30/17 1315 07/04/17 0508  WBC 7.1 5.6  HGB 11.5* 10.3*  HCT 36.0* 32.7*  MCV 87.0 86.1  PLT 199 204   Basic Metabolic Panel:  Recent Labs Lab 07/01/17 0645 07/02/17 0247 07/03/17 0218 07/04/17 0343 07/04/17 1927 07/05/17 0257  NA 133* 133* 131* 132*  --  133*  K 4.2 3.7 3.8 3.4* 3.8 3.6  CL  96* 98* 94* 95*  --  96*  CO2 26 26 29 30   --  28  GLUCOSE 131* 133* 194* 129*  --  108*  BUN 30* 29* 26* 24*  --  19  CREATININE 1.26* 1.50* 1.42* 1.18  --  1.11  CALCIUM 8.1* 8.2* 8.0* 8.0*  --  7.9*  MG  --  2.0 2.2 2.1 2.2 2.1   GFR: Estimated Creatinine Clearance: 60.2 mL/min (by C-G formula based on SCr of 1.11 mg/dL). Liver Function Tests:  Recent Labs Lab 06/30/17 1942 07/02/17 0247  AST 20 20  ALT 119* 78*  ALKPHOS 93 78  BILITOT 1.2 0.7  PROT 6.2* 5.4*  ALBUMIN 2.9* 2.4*   No results for input(s): LIPASE, AMYLASE in the last 168 hours. No results for input(s): AMMONIA in the last 168 hours. Coagulation Profile:  Recent Labs Lab 07/04/17 0508  INR 1.30   Cardiac Enzymes:  Recent Labs Lab 06/30/17 1942 07/01/17 0135 07/01/17 0645  TROPONINI 0.04* 0.03* 0.05*   BNP (last 3 results) No results for input(s): PROBNP in the last 8760 hours. HbA1C: No results for input(s): HGBA1C  in the last 72 hours. CBG:  Recent Labs Lab 07/04/17 1615 07/04/17 2049 07/05/17 0736 07/05/17 1112 07/05/17 1647  GLUCAP 297* 81 148* 147* 203*   Lipid Profile: No results for input(s): CHOL, HDL, LDLCALC, TRIG, CHOLHDL, LDLDIRECT in the last 72 hours. Thyroid Function Tests: No results for input(s): TSH, T4TOTAL, FREET4, T3FREE, THYROIDAB in the last 72 hours. Anemia Panel: No results for input(s): VITAMINB12, FOLATE, FERRITIN, TIBC, IRON, RETICCTPCT in the last 72 hours. Urine analysis: No results found for: COLORURINE, APPEARANCEUR, LABSPEC, PHURINE, GLUCOSEU, HGBUR, BILIRUBINUR, KETONESUR, PROTEINUR, UROBILINOGEN, NITRITE, LEUKOCYTESUR Sepsis Labs: @LABRCNTIP (procalcitonin:4,lacticidven:4)  ) Recent Results (from the past 240 hour(s))  Blood culture (routine x 2)     Status: None   Collection Time: 06/30/17  3:39 PM  Result Value Ref Range Status   Specimen Description BLOOD RIGHT HAND  Final   Special Requests   Final    BOTTLES DRAWN AEROBIC AND ANAEROBIC  Blood Culture adequate volume   Culture NO GROWTH 5 DAYS  Final   Report Status 07/05/2017 FINAL  Final  Culture, blood (Routine X 2) w Reflex to ID Panel     Status: None   Collection Time: 06/30/17  8:44 PM  Result Value Ref Range Status   Specimen Description BLOOD LEFT ANTECUBITAL  Final   Special Requests   Final    BOTTLES DRAWN AEROBIC AND ANAEROBIC Blood Culture adequate volume   Culture NO GROWTH 5 DAYS  Final   Report Status 07/05/2017 FINAL  Final         Radiology Studies: Dg Chest Port 1 View  Result Date: 07/05/2017 CLINICAL DATA:  Aspiration EXAM: PORTABLE CHEST 1 VIEW COMPARISON:  07/02/2017 FINDINGS: Cardiomegaly with vascular congestion. Right perihilar and left lower lobe airspace opacities. Suspect small bilateral effusions. No acute bony abnormality. IMPRESSION: Bilateral airspace opacities could reflect edema or infection. Suspect small effusions. Findings similar to prior study. Electronically Signed   By: Charlett NoseKevin  Dover M.D.   On: 07/05/2017 07:55        Scheduled Meds: . aspirin EC  81 mg Oral Daily  . carvedilol  3.125 mg Oral BID WC  . enoxaparin (LOVENOX) injection  40 mg Subcutaneous Q24H  . furosemide  80 mg Intravenous Q12H  . hydrocerin   Topical BID  . insulin aspart  0-5 Units Subcutaneous QHS  . insulin aspart  0-9 Units Subcutaneous TID WC  . insulin glargine  10 Units Subcutaneous QHS  . pantoprazole  40 mg Oral Daily  . potassium chloride  50 mEq Oral Once  . sodium chloride flush  3 mL Intravenous Q12H  . tamsulosin  0.4 mg Oral Daily   Continuous Infusions: . sodium chloride       LOS: 5 days    Time spent: 40 minutes    WOODS, Roselind MessierURTIS J, MD [facility] Pager [phone]   If 7PM-7AM, please contact night-coverage [email or web address] Password [email or web address] 07/05/2017, 6:43 PM

## 2017-07-05 NOTE — Progress Notes (Signed)
Notified Elray McgregorMary Lynch, on call for [facility], concerning patient's low UO overnight and the fact that the patient had not had a BM in 3 days. She ordered a suppository for this morning for the patient.

## 2017-07-05 NOTE — Progress Notes (Signed)
I called Elray McgregorMary Lynch, on call for [facility], to make her aware of the patient's BP of 80s/50s, HR 80s, UO is adequate at this time. Patient's only complaint is a headache. She did not give any new orders. Just advised us to continue to monitor. Patient has hx of CHF and she did not want to bolus him. Patient resting well in bed at this time.

## 2017-07-05 NOTE — Progress Notes (Signed)
Progress Note  Patient Name: Devin Centerheodore Stringfellow Jr. Date of Encounter: 07/05/2017  Primary Cardiologist: New    Subjective   Constipated   Breathing is fair  No CP    Inpatient Medications    Scheduled Meds: . aspirin EC  81 mg Oral Daily  . bisacodyl  10 mg Rectal Once  . carvedilol  3.125 mg Oral BID WC  . enoxaparin (LOVENOX) injection  40 mg Subcutaneous Q24H  . furosemide  80 mg Intravenous Q12H  . hydrocerin   Topical BID  . insulin aspart  0-5 Units Subcutaneous QHS  . insulin aspart  0-9 Units Subcutaneous TID WC  . insulin glargine  10 Units Subcutaneous QHS  . pantoprazole  40 mg Oral Daily  . sodium chloride flush  3 mL Intravenous Q12H  . tamsulosin  0.4 mg Oral Daily   Continuous Infusions: . sodium chloride     PRN Meds: sodium chloride, acetaminophen, diphenhydrAMINE, ipratropium-albuterol, ondansetron (ZOFRAN) IV, sodium chloride flush   Vital Signs    Vitals:   07/04/17 2215 07/04/17 2315 07/05/17 0005 07/05/17 0356  BP: (!) 81/60 (!) 93/57 (!) 89/61 (!) 88/66  Pulse: 87 83 85 88  Resp: (!) 26 (!) 22 18 (!) 23  Temp:   98.7 F (37.1 C) 97.7 F (36.5 C)  TempSrc:   Oral Oral  SpO2: 92% 96% 99% 96%  Weight:    196 lb 11.9 oz (89.2 kg)  Height:        Intake/Output Summary (Last 24 hours) at 07/05/17 0702 Last data filed at 07/05/17 0641  Gross per 24 hour  Intake                0 ml  Output             2175 ml  Net            -2175 ml      NEt negative 6.8 L    Filed Weights   07/03/17 1223 07/04/17 0500 07/05/17 0356  Weight: 197 lb 5 oz (89.5 kg) 196 lb 13.9 oz (89.3 kg) 196 lb 11.9 oz (89.2 kg)    Telemetry    SR   - Personally Reviewed  ECG    Physical Exam  Frail 76 yo    GEN: No acute distress.   Neck: No JVD Cardiac: RRR, no murmurs, rubs, or gallops.  Respiratory: Clear to auscultation bilaterally. GI: Soft, nontender, non-distended  MS: Tr edema   Neuro:  Nonfocal  Psych: Normal affect   Labs     Chemistry Recent Labs Lab 06/30/17 1942  07/02/17 0247 07/03/17 0218 07/04/17 0343 07/04/17 1927 07/05/17 0257  NA  --   < > 133* 131* 132*  --  133*  K  --   < > 3.7 3.8 3.4* 3.8 3.6  CL  --   < > 98* 94* 95*  --  96*  CO2  --   < > 26 29 30   --  28  GLUCOSE  --   < > 133* 194* 129*  --  108*  BUN  --   < > 29* 26* 24*  --  19  CREATININE 1.26*  < > 1.50* 1.42* 1.18  --  1.11  CALCIUM  --   < > 8.2* 8.0* 8.0*  --  7.9*  PROT 6.2*  --  5.4*  --   --   --   --   ALBUMIN 2.9*  --  2.4*  --   --   --   --  AST 20  --  20  --   --   --   --   ALT 119*  --  78*  --   --   --   --   ALKPHOS 93  --  78  --   --   --   --   BILITOT 1.2  --  0.7  --   --   --   --   GFRNONAA 54*  < > 44* 47* 59*  --  >60  GFRAA >60  < > 51* 54* >60  --  >60  ANIONGAP  --   < > 9 8 7   --  9  < > = values in this interval not displayed.   Hematology  Recent Labs Lab 06/30/17 1315 07/04/17 0508  WBC 7.1 5.6  RBC 4.14* 3.80*  HGB 11.5* 10.3*  HCT 36.0* 32.7*  MCV 87.0 86.1  MCH 27.8 27.1  MCHC 31.9 31.5  RDW 16.0* 16.0*  PLT 199 204    Cardiac Enzymes  Recent Labs Lab 06/30/17 1942 07/01/17 0135 07/01/17 0645  TROPONINI 0.04* 0.03* 0.05*     Recent Labs Lab 06/30/17 1327  TROPIPOC 0.02     BNP  Recent Labs Lab 06/30/17 1315  BNP 2,137.1*     DDimer No results for input(s): DDIMER in the last 168 hours.   Radiology    No results found.  Cardiac Studies   Cath today    Patient Profile       Assessment & Plan    1  Acute on chronic systolic CHF   Severe LV dysfunction LVEF 15%    Cath yesterdaywith severe 3V CAD  PAP 48/21  PCWP 24  LVEDP 13     Surgery seeing pt    2  Chronic systolic CHF  VOlume is improved from admit  Net neg 6.7 L   Severe dysfunction  No changes for now   Signed, Dietrich PatesPaula Felecia Stanfill, MD  07/05/2017, 7:02 AM

## 2017-07-05 NOTE — Evaluation (Signed)
Clinical/Bedside Swallow Evaluation Patient Details  Name: Devin Centerheodore Sigmon Jr. MRN: [id number] Date of Birth: September 24, 1941  Today's Date: 07/05/2017 Time: SLP Start Time (ACUTE ONLY): 1105 SLP Stop Time (ACUTE ONLY): 1115 SLP Time Calculation (min) (ACUTE ONLY): 10 min  Past Medical History:  Past Medical History:  Diagnosis Date  . Charcot's joint of left ankle   . Diabetes mellitus without complication (HCC)   . Diabetic neuropathy (HCC)   . Nonpalpable pulse   . Peripheral angiopathy (HCC)    Past Surgical History:  Past Surgical History:  Procedure Laterality Date  . 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 Admitted 7/23 with CHF with b/l pleural effusions, hyponatremia, transaminitis, per MD "multiple witnessed aspiration events of medicine and food." Per MD notes pt being evaluated for CABG, poor candidate given multiple co-morbidities. Referred for bedside swallowing evaluation. CXR 7/28 showed Bilateral airspace opacities could reflect edema or infection.   Assessment / Plan / Recommendation Clinical Impression  Pt presents with what appears to be grossly function oropharyngeal swallowing function with adequate airway protection at bedside. No overt signs of aspiration noted despite challenging with consecutive straw sips of thin liquids in excess of 3oz. Pt is edentulous and requires extended time for mastication, is noted to take sips of liquid to soften bolus. Great-niece present who states pt has started doing this recently, approximately same time as onset of coughing with eating and drinking. Pt observed by MD, RN coughing with medication  administered with water. Though no overt signs of aspiration noted during today's assessment, suspect that pt may have reduced airway protection with mixed consistencies given complaints, MD/RN observations. Recommend initiating dys 2 diet with thin liquids, medications whole in puree, avoid mixed consistencies (cereal with milk, thin/chunky soups, etc). As SLP returned to pt's room with swallowing precautions, pt noted with excessive belching after drinking liquids, suggestive of ?esophageal dysphagia. Recommend pt remain upright during PO and for 30 minutes afterwards. Pt at mild-moderate risk for aspiration given cognitive impairment, ? esophageal issues. May benefit from GI assessment. SLP will follow for tolerance, advancement as appropriate.  SLP Visit Diagnosis: Dysphagia, unspecified (R13.10)    Aspiration Risk  Mild aspiration risk;Moderate aspiration risk    Diet Recommendation Dysphagia 2 (Fine chop);Thin liquid   Liquid Administration via: Straw;Cup Medication Administration: Whole meds with puree Supervision: Full supervision/cueing for compensatory strategies;Patient able to self feed Compensations: Slow rate;Small sips/bites;Minimize environmental distractions Postural Changes: Seated upright at 90 degrees;Remain upright for at least 30 minutes after po intake    Other  Recommendations Recommended Consults: Consider GI evaluation;Consider esophageal assessment Oral Care Recommendations: Oral care BID   Follow up Recommendations Other (comment) (TBD)      Frequency and Duration min 2x/week  2 weeks       Prognosis Prognosis for Safe Diet Advancement: Fair Barriers to Reach Goals: Cognitive deficits      Swallow Study   General Date of Onset: 06/30/17 HPI: 76 y.o.WM PMHx 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 Admitted 7/23 with CHF with b/l pleural effusions, hyponatremia,  transaminitis, per MD "multiple witnessed aspiration events of medicine and  food." Per MD notes pt being evaluated for CABG, poor candidate given multiple co-morbidities. Referred for bedside swallowing evaluation. CXR 7/28 showed Bilateral airspace opacities could reflect edema or infection. Type of Study: Bedside Swallow Evaluation Previous Swallow Assessment: none in chart Diet Prior to this Study: NPO Temperature Spikes Noted: No Respiratory Status: Nasal cannula History of Recent Intubation: No Behavior/Cognition: Alert;Cooperative;Confused Oral Cavity Assessment: Within Functional Limits Oral Care Completed by SLP: No Oral Cavity - Dentition: Edentulous Vision: Functional for self-feeding Self-Feeding Abilities: Able to feed self Patient Positioning: Upright in bed Baseline Vocal Quality: Normal Volitional Cough: Strong Volitional Swallow: Unable to elicit    Oral/Motor/Sensory Function Overall Oral Motor/Sensory Function: Within functional limits   Ice Chips Ice chips: Within functional limits   Thin Liquid Thin Liquid: Within functional limits Presentation: Cup;Straw;Self Fed    Nectar Thick Nectar Thick Liquid: Not tested   Honey Thick Honey Thick Liquid: Not tested   Puree Puree: Within functional limits Presentation: Self Fed;Spoon   Solid   GO   Solid: Impaired Presentation: Self Fed Oral Phase Impairments: Impaired mastication Oral Phase Functional Implications: Impaired mastication       Devin BatonMary Beth Karina Nofsinger, Devin [facility], Devin [facility] Speech-Language Pathologist [phone]  Devin [facility] 07/05/2017,12:06 PM

## 2017-07-05 NOTE — Plan of Care (Signed)
Problem: Safety: Goal: Ability to remain free from injury will improve Outcome: Progressing Patient remains free of falls during admission. Bed in low and locked position. 3/4 siderails in place. Bed alarm on. Clean and clear environment maintained.  Problem: Cardiovascular: Goal: Ability to achieve and maintain adequate cardiovascular perfusion will improve Outcome: Progressing Pulses are +2. Extremities are dry and warm bilaterally. O2 sats are adequate on 3L [place]. Patient denies chest pain and SOB.

## 2017-07-06 DIAGNOSIS — I951 Orthostatic hypotension: Secondary | ICD-10-CM

## 2017-07-06 LAB — GLUCOSE, CAPILLARY
GLUCOSE-CAPILLARY: 170 mg/dL — AB (ref 65–99)
GLUCOSE-CAPILLARY: 210 mg/dL — AB (ref 65–99)
Glucose-Capillary: 189 mg/dL — ABNORMAL HIGH (ref 65–99)

## 2017-07-06 LAB — MAGNESIUM: MAGNESIUM: 2.1 mg/dL (ref 1.7–2.4)

## 2017-07-06 LAB — BASIC METABOLIC PANEL
Anion gap: 9 (ref 5–15)
BUN: 22 mg/dL — AB (ref 6–20)
CALCIUM: 8.3 mg/dL — AB (ref 8.9–10.3)
CO2: 28 mmol/L (ref 22–32)
CREATININE: 1.22 mg/dL (ref 0.61–1.24)
Chloride: 97 mmol/L — ABNORMAL LOW (ref 101–111)
GFR calc non Af Amer: 56 mL/min — ABNORMAL LOW (ref >60)
Glucose, Bld: 110 mg/dL — ABNORMAL HIGH (ref 65–99)
Potassium: 4.2 mmol/L (ref 3.5–5.1)
SODIUM: 134 mmol/L — AB (ref 135–145)

## 2017-07-06 MED ORDER — ATORVASTATIN CALCIUM 20 MG PO TABS
20.0000 mg | ORAL_TABLET | Freq: Every day | ORAL | Status: DC
  Administered 2017-07-06: 20 mg via ORAL
  Filled 2017-07-06: qty 1

## 2017-07-06 MED ORDER — FUROSEMIDE 20 MG PO TABS
20.0000 mg | ORAL_TABLET | Freq: Two times a day (BID) | ORAL | Status: DC
  Administered 2017-07-06: 20 mg via ORAL
  Filled 2017-07-06: qty 1

## 2017-07-06 MED ORDER — INSULIN ASPART 100 UNIT/ML ~~LOC~~ SOLN
0.0000 [IU] | SUBCUTANEOUS | Status: DC
  Administered 2017-07-07: 3 [IU] via SUBCUTANEOUS
  Administered 2017-07-07: 2 [IU] via SUBCUTANEOUS

## 2017-07-06 MED ORDER — INSULIN GLARGINE 100 UNIT/ML ~~LOC~~ SOLN
15.0000 [IU] | Freq: Every day | SUBCUTANEOUS | Status: DC
  Administered 2017-07-06: 15 [IU] via SUBCUTANEOUS
  Filled 2017-07-06 (×2): qty 0.15

## 2017-07-06 MED ORDER — GUAIFENESIN 100 MG/5ML PO SOLN
5.0000 mL | ORAL | Status: DC | PRN
  Administered 2017-07-06 – 2017-07-08 (×3): 100 mg via ORAL
  Filled 2017-07-06 (×3): qty 5

## 2017-07-06 MED ORDER — SODIUM CHLORIDE 0.9 % IV BOLUS (SEPSIS)
500.0000 mL | Freq: Once | INTRAVENOUS | Status: AC
  Administered 2017-07-06: 500 mL via INTRAVENOUS

## 2017-07-06 NOTE — Progress Notes (Signed)
Progress Note  Patient Name: Devin [facility]. Date of Encounter: 07/06/2017  Primary Cardiologist: New    Subjective     Pt is resting comfortably    Inpatient Medications    Scheduled Meds: . aspirin EC  81 mg Oral Daily  . carvedilol  3.125 mg Oral BID WC  . enoxaparin (LOVENOX) injection  40 mg Subcutaneous Q24H  . furosemide  80 mg Intravenous Q12H  . hydrocerin   Topical BID  . insulin aspart  0-5 Units Subcutaneous QHS  . insulin aspart  0-9 Units Subcutaneous TID WC  . insulin glargine  12 Units Subcutaneous QHS  . pantoprazole  40 mg Oral Daily  . sodium chloride flush  3 mL Intravenous Q12H  . tamsulosin  0.4 mg Oral Daily   Continuous Infusions: . sodium chloride     PRN Meds: sodium chloride, acetaminophen, diphenhydrAMINE, ipratropium-albuterol, ondansetron (ZOFRAN) IV, sodium chloride flush   Vital Signs    Vitals:   07/05/17 2023 07/05/17 2045 07/05/17 2351 07/06/17 0353  BP: 99/71 96/67 98/71  (!) 84/59  Pulse: (!) 102 99 89 96  Resp: 12 (!) 32 (!) 30 17  Temp: 97.9 F (36.6 C)  98.4 F (36.9 C) 98 F (36.7 C)  TempSrc: Oral  Oral Oral  SpO2: 99%  91% 98%  Weight:    196 lb 10.4 oz (89.2 kg)  Height:        Intake/Output Summary (Last 24 hours) at 07/06/17 0723 Last data filed at 07/05/17 2352  Gross per 24 hour  Intake                0 ml  Output             1025 ml  Net            -1025 ml      NEt negative 6.8 L    Filed Weights   07/04/17 0500 07/05/17 0356 07/06/17 0353  Weight: 196 lb 13.9 oz (89.3 kg) 196 lb 11.9 oz (89.2 kg) 196 lb 10.4 oz (89.2 kg)    Telemetry    SR   - Personally Reviewed  ECG    Physical Exam  Frail 76 yo    GEN: No acute distress.   Neck: No JVD Cardiac: RRR, no murmurs, rubs, or gallops.  Respiratory: Clear to auscultation bilaterally. GI: Soft, nontender, non-distended  MS: No edema  Legs deformed   Neuro:  Nonfocal  Psych: Normal affect   Labs    Chemistry Recent Labs Lab  06/30/17 1942  07/02/17 0247  07/04/17 0343 07/04/17 1927 07/05/17 0257 07/06/17 0435  NA  --   < > 133*  < > 132*  --  133* 134*  K  --   < > 3.7  < > 3.4* 3.8 3.6 4.2  CL  --   < > 98*  < > 95*  --  96* 97*  CO2  --   < > 26  < > 30  --  28 28  GLUCOSE  --   < > 133*  < > 129*  --  108* 110*  BUN  --   < > 29*  < > 24*  --  19 22*  CREATININE 1.26*  < > 1.50*  < > 1.18  --  1.11 1.22  CALCIUM  --   < > 8.2*  < > 8.0*  --  7.9* 8.3*  PROT 6.2*  --  5.4*  --   --   --   --   --  ALBUMIN 2.9*  --  2.4*  --   --   --   --   --   AST 20  --  20  --   --   --   --   --   ALT 119*  --  78*  --   --   --   --   --   ALKPHOS 93  --  78  --   --   --   --   --   BILITOT 1.2  --  0.7  --   --   --   --   --   GFRNONAA 54*  < > 44*  < > 59*  --  >60 56*  GFRAA >60  < > 51*  < > >60  --  >60 >60  ANIONGAP  --   < > 9  < > 7  --  9 9  < > = values in this interval not displayed.   Hematology  Recent Labs Lab 06/30/17 1315 07/04/17 0508  WBC 7.1 5.6  RBC 4.14* 3.80*  HGB 11.5* 10.3*  HCT 36.0* 32.7*  MCV 87.0 86.1  MCH 27.8 27.1  MCHC 31.9 31.5  RDW 16.0* 16.0*  PLT 199 204    Cardiac Enzymes  Recent Labs Lab 06/30/17 1942 07/01/17 0135 07/01/17 0645  TROPONINI 0.04* 0.03* 0.05*     Recent Labs Lab 06/30/17 1327  TROPIPOC 0.02     BNP  Recent Labs Lab 06/30/17 1315  BNP 2,137.1*     DDimer No results for input(s): DDIMER in the last 168 hours.   Radiology    Dg Chest Port 1 View  Result Date: 07/05/2017 CLINICAL DATA:  Aspiration EXAM: PORTABLE CHEST 1 VIEW COMPARISON:  07/02/2017 FINDINGS: Cardiomegaly with vascular congestion. Right perihilar and left lower lobe airspace opacities. Suspect small bilateral effusions. No acute bony abnormality. IMPRESSION: Bilateral airspace opacities could reflect edema or infection. Suspect small effusions. Findings similar to prior study. Electronically Signed   By: Charlett NoseKevin  Dover M.D.   On: 07/05/2017 07:55    Cardiac  Studies    Patient Profile       Assessment & Plan    1  Acute on chronic systolic CHF   Severe LV dysfunction LVEF 15%    Cath with severe 3V CAD  PAP 48/21  PCWP 24  LVEDP 13    He is being seen by surgery      From a medical standpint I think he is optimized On b blocker  Switch lasix to po  HE will net tolerate any further escalation of meds with low BP    2  CAD  As above    3  HL  Add statin 20  lipitor  LDL 68  HDL 21    Signed, Dietrich PatesPaula Kemonte Ullman, MD  07/06/2017, 7:23 AM

## 2017-07-06 NOTE — Plan of Care (Signed)
Problem: Safety: Goal: Ability to remain free from injury will improve Outcome: Progressing No incidence of falls during this admission. Call bell within reach. Bed in low and locked position. Clean and clear environment maintained. 3/4 siderails in place. Bed alarm on. Patient verbalized understanding of safety instruction.  Problem: Cardiac: Goal: Ability to achieve and maintain adequate cardiopulmonary perfusion will improve Outcome: Progressing Extremities are warm and dry and without numbness or tingling. No chest pain or SOB. Pulses are palpable with +2 radial and +1 dorsalis pedis bilaterally.

## 2017-07-06 NOTE — Progress Notes (Signed)
PROGRESS NOTE    Devin Merritt.  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   Presented to [facility] sent by PCP for elevated LFTs. There were thought to be elevated from venous congestion. HE had an echo there that showed EF 25%, global hypokinesis and grade 3 diastolic dsfn. Family states patient is weight at physicians office~200 pounds. Family has notice increased swelling in his LE. Patient is a poor historian and unable to provide information other than his "butt hurts". Family states he has been unbale to lay flat and coughing more than usual.    Subjective: 7/29 A/O 3 (did not know where), quickly becomes confused. At times sees people in the room which were not there i.e. hallucinations. Negative CP, positive acute on chronic SOB   Assessment & Plan:   Active Problems:   CHF (congestive heart failure) (HCC)   Hyponatremia   Transaminitis   Acute systolic CHF (congestive heart failure) (HCC)   Diabetes (HCC)   Decubitus ulcer   Acute on chronic combined systolic and diastolic CHF (congestive heart failure) (HCC)   Acute systolic and diastolic CHF with b/l pleural effusions due to CHF -Patient over diuresed, now with orthostatic hypotension when PT attempted to evaluate patient  -Continue Coreg 3.125 mg BID -DC Lasix -Bolus normal saline 500 ml -Strict I&O since admission -  7.2  L -Daily weight Filed Weights   07/04/17 0500 07/05/17 0356 07/06/17 0353  Weight: 196 lb 13.9 oz (89.3 kg) 196 lb 11.9 oz (89.2 kg) 196 lb 10.4 oz (89.2 kg)  -7/27 S/P Right/Left heart catheterization, severe disease not amenable to conservative management. See results below -Patient was being evaluated for CABG. Addendum to cardiothoracic note 7/27 states Patient is not a  candidate for CABG Rec medical therapy of heart failure:Do not recommend viability study -PT/OT consult placed -7/29 palliative care consult placed to goals of care  Orthostatic hypotension -PT attempted evaluation when patient sat on side of bed became orthostatic evaluation terminated. -See CHF  Respiratory Alkalosis .-Resolved  Aspiration -7/28 patient passed swallow study: On dysphagia 2 diet with thin.  Hyponatremia -Continue to improve  Transaminitis -hold statin -RUQ U/S done at [facility] thickening of unknown significance, no ductal dilitation  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 -7/25 Hemoglobin A1c= 9.0 -Lipid panel within ADA guidelines -7/29   increase Lantus 15 units daily -Increase to moderate SSI   Dementia -Appears to be worsening, maybe partially secondary to hypotension -lives with niece who takes care of him and acts as his HCPOA  Hypokalemia -Potassium goal> 4  Multiple scrotal lesions -Barrier cream applied  Goals of Care  -7/29 Palliative Care consult; Pt with SEVERE CHF requiring CABG. CTS has declined 2dary to risk. discuss code status, short term vs Long term goals,Hospice    DVT prophylaxis: Lovenox Code Status: Full Family Communication:  none Disposition Plan: per cardiology   Consultants:  Cardiology Cardiothoracic surgery    Procedures/Significant Events:  PCXR on 7/25: Worsening interstitial edema/ worsening left retrocardiac consolidation/atelectasis 7/27 Right/Left Heart cath:,-Mid RCA lesion, 90 %stenosed.-Acute Mrg lesion, 90 %stenosed.Suezanne Jacquet LAD lesion, 70 %stenosed.Suezanne Jacquet LAD to Prox LAD lesion, 95 %stenosed. -1st Diag lesion, 90 %stenosed.Suezanne Jacquet 2nd Diag to 2nd Diag lesion, 90 %stenosed. -Prox LAD to Mid LAD lesion, 85 %stenosed.-Ost Cx lesion, 99 %stenosed. -  Ost 1st Mrg lesion, 90 %stenosed.   VENTILATOR SETTINGS: None   Cultures None  Antimicrobials: None   Devices None       LINES / TUBES:  None   Continuous Infusions: . sodium chloride    . sodium chloride       Objective: Vitals:   07/05/17 2351 07/06/17 0353 07/06/17 0816 07/06/17 1249  BP: 98/71 (!) 84/59 (!) 87/60 (!) 95/47  Pulse: 89 96    Resp: (!) 30 17    Temp: 98.4 F (36.9 C) 98 F (36.7 C) 98.2 F (36.8 C) 98.1 F (36.7 C)  TempSrc: Oral Oral Oral Oral  SpO2: 91% 98% 98% 100%  Weight:  196 lb 10.4 oz (89.2 kg)    Height:        Intake/Output Summary (Last 24 hours) at 07/06/17 1648 Last data filed at 07/06/17 1015  Gross per 24 hour  Intake              603 ml  Output              525 ml  Net               78 ml   Filed Weights   07/04/17 0500 07/05/17 0356 07/06/17 0353  Weight: 196 lb 13.9 oz (89.3 kg) 196 lb 11.9 oz (89.2 kg) 196 lb 10.4 oz (89.2 kg)    Examination:  Physical Exam: Vitals:   07/06/17 1249 07/06/17 1620 07/06/17 1652 07/06/17 1700  BP: (!) 95/47 92/64 103/69 103/69  Pulse:  (!) 106  (!) 104  Resp:      Temp: 98.1 F (36.7 C)  98.5 F (36.9 C)   TempSrc: Oral  Oral   SpO2: 100%  98%   Weight:      Height:        Wt Readings from Last 3 Encounters:  07/06/17 196 lb 10.4 oz (89.2 kg)    General: A/O 3, hallucinations, positive acute respiratory distress Lungs: Clear to auscultation bilaterally without wheezes or crackles Cardiovascular: Regular rate and rhythm without murmur gallop or rub normal S1 and S2 Abdomen: negative abdominal pain, nondistended, positive soft, bowel sounds, no rebound, no ascites, no appreciable mass Extremities: LLE Charcot's foot Skin: Negative rashes, lesions, ulcers Psychiatric:  Positive hallucinations, positive depression, positive anxiety  Central nervous system:  Cranial nerves II through XII intact, tongue/uvula midline, all extremities muscle strength 5/5, sensation intact throughout, negative dysarthria, negative expressive aphasia, negative receptive aphasia.      Data Reviewed: Care during the  described time interval was provided by me .  I have reviewed this patient's available data, including medical history, events of note, physical examination, and all test results as part of my evaluation. I have personally reviewed and interpreted all radiology studies.  CBC:  Recent Labs Lab 06/30/17 1315 07/04/17 0508  WBC 7.1 5.6  HGB 11.5* 10.3*  HCT 36.0* 32.7*  MCV 87.0 86.1  PLT 199 204   Basic Metabolic Panel:  Recent Labs Lab 07/02/17 0247 07/03/17 0218 07/04/17 0343 07/04/17 1927 07/05/17 0257 07/06/17 0435  NA 133* 131* 132*  --  133* 134*  K 3.7 3.8 3.4* 3.8 3.6 4.2  CL 98* 94* 95*  --  96* 97*  CO2 26 29 30   --  28 28  GLUCOSE 133* 194* 129*  --  108* 110*  BUN 29* 26* 24*  --  19 22*  CREATININE 1.50* 1.42* 1.18  --  1.11 1.22  CALCIUM 8.2* 8.0* 8.0*  --  7.9* 8.3*  MG 2.0 2.2 2.1 2.2 2.1 2.1   GFR: Estimated Creatinine Clearance: 54.8 mL/min (by C-G formula based on SCr of 1.22 mg/dL). Liver Function Tests:  Recent Labs Lab 06/30/17 1942 07/02/17 0247  AST 20 20  ALT 119* 78*  ALKPHOS 93 78  BILITOT 1.2 0.7  PROT 6.2* 5.4*  ALBUMIN 2.9* 2.4*   No results for input(s): LIPASE, AMYLASE in the last 168 hours. No results for input(s): AMMONIA in the last 168 hours. Coagulation Profile:  Recent Labs Lab 07/04/17 0508  INR 1.30   Cardiac Enzymes:  Recent Labs Lab 06/30/17 1942 07/01/17 0135 07/01/17 0645  TROPONINI 0.04* 0.03* 0.05*   BNP (last 3 results) No results for input(s): PROBNP in the last 8760 hours. HbA1C: No results for input(s): HGBA1C in the last 72 hours. CBG:  Recent Labs Lab 07/05/17 0736 07/05/17 1112 07/05/17 1647 07/05/17 2016 07/06/17 1615  GLUCAP 148* 147* 203* 160* 210*   Lipid Profile: No results for input(s): CHOL, HDL, LDLCALC, TRIG, CHOLHDL, LDLDIRECT in the last 72 hours. Thyroid Function Tests: No results for input(s): TSH, T4TOTAL, FREET4, T3FREE, THYROIDAB in the last 72 hours. Anemia  Panel: No results for input(s): VITAMINB12, FOLATE, FERRITIN, TIBC, IRON, RETICCTPCT in the last 72 hours. Urine analysis: No results found for: COLORURINE, APPEARANCEUR, LABSPEC, PHURINE, GLUCOSEU, HGBUR, BILIRUBINUR, KETONESUR, PROTEINUR, UROBILINOGEN, NITRITE, LEUKOCYTESUR Sepsis Labs: @LABRCNTIP (procalcitonin:4,lacticidven:4)  ) Recent Results (from the past 240 hour(s))  Blood culture (routine x 2)     Status: None   Collection Time: 06/30/17  3:39 PM  Result Value Ref Range Status   Specimen Description BLOOD RIGHT HAND  Final   Special Requests   Final    BOTTLES DRAWN AEROBIC AND ANAEROBIC Blood Culture adequate volume   Culture NO GROWTH 5 DAYS  Final   Report Status 07/05/2017 FINAL  Final  Culture, blood (Routine X 2) w Reflex to ID Panel     Status: None   Collection Time: 06/30/17  8:44 PM  Result Value Ref Range Status   Specimen Description BLOOD LEFT ANTECUBITAL  Final   Special Requests   Final    BOTTLES DRAWN AEROBIC AND ANAEROBIC Blood Culture adequate volume   Culture NO GROWTH 5 DAYS  Final   Report Status 07/05/2017 FINAL  Final         Radiology Studies: Dg Chest Port 1 View  Result Date: 07/05/2017 CLINICAL DATA:  Aspiration EXAM: PORTABLE CHEST 1 VIEW COMPARISON:  07/02/2017 FINDINGS: Cardiomegaly with vascular congestion. Right perihilar and left lower lobe airspace opacities. Suspect small bilateral effusions. No acute bony abnormality. IMPRESSION: Bilateral airspace opacities could reflect edema or infection. Suspect small effusions. Findings similar to prior study. Electronically Signed   By: Charlett Nose M.D.   On: 07/05/2017 07:55        Scheduled Meds: . aspirin EC  81 mg Oral Daily  . atorvastatin  20 mg Oral q1800  . carvedilol  3.125 mg Oral BID WC  . enoxaparin (LOVENOX) injection  40 mg Subcutaneous Q24H  . furosemide  20 mg Oral BID  . hydrocerin   Topical BID  . insulin aspart  0-5 Units Subcutaneous QHS  . insulin aspart  0-9  Units Subcutaneous TID WC  . insulin glargine  12 Units Subcutaneous QHS  . pantoprazole  40 mg Oral Daily  . sodium chloride flush  3 mL Intravenous Q12H  . tamsulosin  0.4 mg Oral Daily  Continuous Infusions: . sodium chloride    . sodium chloride       LOS: 6 days    Time spent: 40 minutes    WOODS, Roselind MessierURTIS J, MD [facility] Pager [phone]   If 7PM-7AM, please contact night-coverage [email or web address] Password [email or web address] 07/06/2017, 4:48 PM

## 2017-07-06 NOTE — Evaluation (Signed)
Physical Therapy Evaluation Patient Details Name: Devin Devin Robins Jr. MRN: [id number] DOB: 1940-12-19 Today's Date: 07/06/2017   History of Present Illness  76 y.o. 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  Admitted 7/23 with CHF with b/l pleural effusions, hyponatremia, transaminitis,   Clinical Impression  Patient presents with decreased mobility due to weakness, decreased activity tolerance with severe orthostasis in sitting.  Patient will benefit from skilled PT in the acute setting to address issues as well as progress to SNF level rehab at d/c.    Follow Up Recommendations SNF (family interested in [facility])    Equipment Recommendations  Other (comment) (Defer to next venue)    Recommendations for Other Services       Precautions / Restrictions Precautions Precautions: Fall Precaution Comments: pt crawls to get around at home      Mobility  Bed Mobility Overal bed mobility: Needs Assistance Bed Mobility: Supine to Sit;Sit to Supine     Supine to sit: Mod assist Sit to supine: Mod assist;+2 for physical assistance   General bed mobility comments: assist to guide feet of EOB and to lift trunk upright, pt with low BP sitting so returned to supine with +2 A RN assist with trunk, PT assist with lifting legs  Transfers                    Ambulation/Gait                Stairs            Wheelchair Mobility    Modified Rankin (Stroke Patients Only)       Balance Overall balance assessment: Needs assistance Sitting-balance support: Feet unsupported Sitting balance-Leahy Scale: Fair Sitting balance - Comments: flexed posture with forward head at EOB eyes closed but responding despite BP 58/45.                                     Pertinent Vitals/Pain Pain Assessment: Faces Faces Pain Scale: Hurts even  more Pain Location: feet with movement Pain Descriptors / Indicators: Grimacing;Guarding Pain Intervention(s): Monitored during session;Repositioned    Home Living Family/patient expects to be discharged to:: Private residence Living Arrangements: Other relatives;Alone Available Help at Discharge: Available PRN/intermittently;Family Type of Home: Other(Comment) (camper) Home Access: Stairs to enter   Entrance Stairs-Number of Steps: 3 Home Layout: One level   Additional Comments: lives in camper behind neice's home    Prior Function Level of Independence: Needs assistance   Gait / Transfers Assistance Needed: crawls around camper, assist up stairs to access           Hand Dominance        Extremity/Trunk Assessment   Upper Extremity Assessment Upper Extremity Assessment: Overall WFL for tasks assessed    Lower Extremity Assessment Lower Extremity Assessment: LLE deficits/detail;RLE deficits/detail RLE Deficits / Details: lifts antigravity, but ankles both wrapped with Kerlix and L ankle with obvious joint deformity; pt does not want me to touch his feet  LLE Deficits / Details: lifts antigravity, but ankles both wrapped with Kerlix and L ankle with obvious joint deformity; pt does not want me to touch his feet        Communication   Communication: No difficulties  Cognition Arousal/Alertness: Awake/alert Behavior During Therapy: Anxious Overall Cognitive Status:  No family/caregiver present to determine baseline cognitive functioning                                 General Comments: Patient deferring answers to functioning questions to neice      General Comments General comments (skin integrity, edema, etc.): Spoke by phone with neice who is pt's caregiver and reports he has been to [facility] in the past and did better with progress in therapy despite having the option to stay at neice's home.    Exercises     Assessment/Plan     PT Assessment Patient needs continued PT services  PT Problem List Decreased strength;Decreased mobility;Decreased activity tolerance;Decreased balance;Cardiopulmonary status limiting activity       PT Treatment Interventions DME instruction;Functional mobility training;Balance training;Therapeutic exercise;Patient/family education;Therapeutic activities;Wheelchair mobility training    PT Goals (Current goals can be found in the Care Plan section)  Acute Rehab PT Goals Patient Stated Goal: To go to rehab PT Goal Formulation: With patient/family Time For Goal Achievement: 07/20/17 Potential to Achieve Goals: Fair    Frequency Min 3X/week   Barriers to discharge        Co-evaluation               AM-PAC PT "6 Clicks" Daily Activity  Outcome Measure Difficulty turning over in bed (including adjusting bedclothes, sheets and blankets)?: Total Difficulty moving from lying on back to sitting on the side of the bed? : Total Difficulty sitting down on and standing up from a chair with arms (e.g., wheelchair, bedside commode, etc,.)?: Total Help needed moving to and from a bed to chair (including a wheelchair)?: Total Help needed walking in hospital room?: Total Help needed climbing 3-5 steps with a railing? : Total 6 Click Score: 6    End of Session   Activity Tolerance: Treatment limited secondary to medical complications (Comment) (orthostatic in sitting) Patient left: in bed;with call bell/phone within reach;with nursing/sitter in room   PT Visit Diagnosis: Muscle weakness (generalized) (M62.81)    Time: [id number] PT Time Calculation (min) (ACUTE ONLY): 23 min   Charges:   PT Evaluation $PT Eval Moderate Complexity: 1 Procedure PT Treatments $Gait Training: 8-22 mins   PT G CodesSheran Merritt:        Devin Merritt, [place] CarolinaPT [phone] 07/06/2017   Devin Merritt 07/06/2017, 5:17 PM

## 2017-07-07 DIAGNOSIS — R627 Adult failure to thrive: Secondary | ICD-10-CM

## 2017-07-07 DIAGNOSIS — Z66 Do not resuscitate: Secondary | ICD-10-CM

## 2017-07-07 DIAGNOSIS — Z515 Encounter for palliative care: Secondary | ICD-10-CM

## 2017-07-07 DIAGNOSIS — I420 Dilated cardiomyopathy: Secondary | ICD-10-CM

## 2017-07-07 LAB — GLUCOSE, CAPILLARY
GLUCOSE-CAPILLARY: 108 mg/dL — AB (ref 65–99)
GLUCOSE-CAPILLARY: 266 mg/dL — AB (ref 65–99)
Glucose-Capillary: 167 mg/dL — ABNORMAL HIGH (ref 65–99)
Glucose-Capillary: 138 mg/dL — ABNORMAL HIGH (ref 65–99)
Glucose-Capillary: 146 mg/dL — ABNORMAL HIGH (ref 65–99)

## 2017-07-07 LAB — MRSA PCR SCREENING: MRSA by PCR: NEGATIVE

## 2017-07-07 LAB — MAGNESIUM: Magnesium: 2.3 mg/dL (ref 1.7–2.4)

## 2017-07-07 MED ORDER — ACETAMINOPHEN 325 MG PO TABS
650.0000 mg | ORAL_TABLET | ORAL | Status: DC | PRN
  Administered 2017-07-07: 650 mg via ORAL

## 2017-07-07 MED ORDER — FUROSEMIDE 10 MG/ML IJ SOLN
40.0000 mg | Freq: Once | INTRAMUSCULAR | Status: AC
  Administered 2017-07-07: 40 mg via INTRAVENOUS
  Filled 2017-07-07: qty 4

## 2017-07-07 MED ORDER — ALBUTEROL SULFATE (2.5 MG/3ML) 0.083% IN NEBU: 2.5000 mg | INHALATION_SOLUTION | RESPIRATORY_TRACT | Status: DC | PRN

## 2017-07-07 MED ORDER — MORPHINE SULFATE (CONCENTRATE) 10 MG/0.5ML PO SOLN
5.0000 mg | ORAL | Status: DC | PRN
  Administered 2017-07-07 – 2017-07-08 (×5): 5 mg via ORAL
  Filled 2017-07-07 (×5): qty 0.5

## 2017-07-07 MED ORDER — ZOLPIDEM TARTRATE 5 MG PO TABS
5.0000 mg | ORAL_TABLET | Freq: Once | ORAL | Status: AC
  Administered 2017-07-07: 5 mg via ORAL
  Filled 2017-07-07: qty 1

## 2017-07-07 MED ORDER — LORAZEPAM 2 MG/ML IJ SOLN
0.5000 mg | Freq: Four times a day (QID) | INTRAMUSCULAR | Status: DC | PRN
  Administered 2017-07-08: 1 mg via INTRAVENOUS
  Administered 2017-07-08: 0.5 mg via INTRAVENOUS
  Filled 2017-07-07 (×2): qty 1

## 2017-07-07 NOTE — Care Management Important Message (Signed)
Important Message  Patient Details  Name: Devin Centerheodore Araiza Jr. MRN: [id number] Date of Birth: 1941/08/03   Medicare Important Message Given:  Yes    [place] Kestner 07/07/2017, 2:27 PM

## 2017-07-07 NOTE — Plan of Care (Signed)
Problem: Pain Managment: Goal: General experience of comfort will improve Outcome: Progressing Pt verbalized some discomfort and generalized pain during the shift. Pt tolerated pain medication with effective results during this shift. Pt   Problem: Skin Integrity: Goal: Risk for impaired skin integrity will decrease Outcome: Progressing Pt has been repositioned q2hours and had sacral foam changed during the shift.

## 2017-07-07 NOTE — Progress Notes (Signed)
CSW continuing to follow. CSW met with patient and family at bedside. Patient's nieces and great-niece were present. Patient's niece, Elgie Congo, is POA. CSW discussed palliative meeting family had, and family indicated preference for home with hospice. Follow up with palliative team indicated patient may be appropriate for residential hospice. Medical team to monitor and determine disposition tomorrow. CSW to continue to follow and support with disposition planning.  Estanislado Emms, [place]

## 2017-07-07 NOTE — Progress Notes (Signed)
  Speech Language Pathology Treatment: Dysphagia  Patient Details Name: Devin Centerheodore Tomkinson Jr. MRN: [id number] DOB: 02-Jan-1941 Today's Date: 07/07/2017 Time: [id number] SLP Time Calculation (min) (ACUTE ONLY): 13 min  Assessment / Plan / Recommendation Clinical Impression  Dysphagia treatment provided for diet tolerance check/ trial of advanced solid foods. Pt tolerated sips of thin liquid without overt s/s of aspiration. Pt initially appeared to tolerate regular solid trial well; however after about 1 minute pt had a prolonged cough, ? possible aspiration of residuals or refluxed material. Spoke with RN and NT who did not have significant concerns with the pt's consumption during meals this morning, although NT reported pt consumed very little. Recommend continuing dysphagia 2 diet/ thin liquids, meds whole in puree, full supervision to cue small bites/ sips, ensure pt seated upright 30-60 minutes after meal. Will f/u to ensure diet tolerance; prognosis for safe advancement seems guarded given edentulous status and family report that coughing during meals has been an ongoing issue.     HPI HPI: 76 y.o.WM PMHx Chronic Systolic and Diastolic CHF (LVEF of 25%), Decubitus, DM type 2, dementia, recently at [facility] from 7/19-7/21. Admitted 7/23 with CHF with b/l pleural effusions, hyponatremia, transaminitis, per MD "multiple witnessed aspiration events of medicine and food." Per MD notes pt being evaluated for CABG, poor candidate given multiple co-morbidities. Referred for bedside swallowing evaluation. CXR 7/28 showed Bilateral airspace opacities could reflect edema or infection.      SLP Plan  Continue with current plan of care       Recommendations  Diet recommendations: Dysphagia 2 (fine chop);Thin liquid Liquids provided via: Cup;Straw Medication Administration: Whole meds with puree Supervision: Staff to assist with self feeding;Full supervision/cueing for compensatory  strategies Compensations: Slow rate;Small sips/bites Postural Changes and/or Swallow Maneuvers: Seated upright 90 degrees;Upright 30-60 min after meal                Oral Care Recommendations: Oral care BID Follow up Recommendations: 24 hour supervision/assistance SLP Visit Diagnosis: Dysphagia, unspecified (R13.10) Plan: Continue with current plan of care       GO                Metro KungAmy K Nishawn Rotan, MA, CCC-SLP 07/07/2017, 2:52 PM

## 2017-07-07 NOTE — Progress Notes (Addendum)
Progress Note  Patient Name: Devin Centerheodore Raske Jr. Date of Encounter: 07/07/2017  Primary Cardiologist: Dr. Tenny Crawoss  Subjective   Patient denies any chest pain or SOB.  Inpatient Medications    Scheduled Meds: . aspirin EC  81 mg Oral Daily  . atorvastatin  20 mg Oral q1800  . carvedilol  3.125 mg Oral BID WC  . enoxaparin (LOVENOX) injection  40 mg Subcutaneous Q24H  . hydrocerin   Topical BID  . insulin aspart  0-15 Units Subcutaneous Q4H  . insulin aspart  0-5 Units Subcutaneous QHS  . insulin glargine  15 Units Subcutaneous QHS  . pantoprazole  40 mg Oral Daily  . sodium chloride flush  3 mL Intravenous Q12H  . tamsulosin  0.4 mg Oral Daily   Continuous Infusions: . sodium chloride     PRN Meds: sodium chloride, acetaminophen, diphenhydrAMINE, guaiFENesin, ipratropium-albuterol, ondansetron (ZOFRAN) IV, sodium chloride flush   Vital Signs    Vitals:   07/07/17 0130 07/07/17 0300 07/07/17 0535 07/07/17 0741  BP:  104/70 92/68 100/75  Pulse: 82 85 87 95  Resp: 14 (!) 34 (!) 22 (!) 31  Temp:   97.8 F (36.6 C) 97.7 F (36.5 C)  TempSrc:   Axillary Axillary  SpO2: 99% 99% 100% 99%  Weight:   186 lb 8 oz (84.6 kg)   Height:        Intake/Output Summary (Last 24 hours) at 07/07/17 0839 Last data filed at 07/07/17 0700  Gross per 24 hour  Intake              603 ml  Output             1250 ml  Net             -647 ml   Filed Weights   07/05/17 0356 07/06/17 0353 07/07/17 0535  Weight: 196 lb 11.9 oz (89.2 kg) 196 lb 10.4 oz (89.2 kg) 186 lb 8 oz (84.6 kg)    Telemetry    NSR - Personally Reviewed  ECG    No new EKG to review - Personally Reviewed  Physical Exam   GEN: No acute distress.   Neck: No JVD Cardiac: RRR, no murmurs, rubs, or gallops.  Respiratory: Clear to auscultation bilaterally. GI: Soft, nontender, non-distended  MS: No edema; No deformity. Neuro:  Nonfocal  Psych: Normal affect   Labs    Chemistry Recent Labs Lab  06/30/17 1942  07/02/17 0247  07/04/17 0343 07/04/17 1927 07/05/17 0257 07/06/17 0435  NA  --   < > 133*  < > 132*  --  133* 134*  K  --   < > 3.7  < > 3.4* 3.8 3.6 4.2  CL  --   < > 98*  < > 95*  --  96* 97*  CO2  --   < > 26  < > 30  --  28 28  GLUCOSE  --   < > 133*  < > 129*  --  108* 110*  BUN  --   < > 29*  < > 24*  --  19 22*  CREATININE 1.26*  < > 1.50*  < > 1.18  --  1.11 1.22  CALCIUM  --   < > 8.2*  < > 8.0*  --  7.9* 8.3*  PROT 6.2*  --  5.4*  --   --   --   --   --   ALBUMIN 2.9*  --  2.4*  --   --   --   --   --   AST 20  --  20  --   --   --   --   --   ALT 119*  --  78*  --   --   --   --   --   ALKPHOS 93  --  78  --   --   --   --   --   BILITOT 1.2  --  0.7  --   --   --   --   --   GFRNONAA 54*  < > 44*  < > 59*  --  >60 56*  GFRAA >60  < > 51*  < > >60  --  >60 >60  ANIONGAP  --   < > 9  < > 7  --  9 9  < > = values in this interval not displayed.   Hematology Recent Labs Lab 06/30/17 1315 07/04/17 0508  WBC 7.1 5.6  RBC 4.14* 3.80*  HGB 11.5* 10.3*  HCT 36.0* 32.7*  MCV 87.0 86.1  MCH 27.8 27.1  MCHC 31.9 31.5  RDW 16.0* 16.0*  PLT 199 204    Cardiac Enzymes Recent Labs Lab 06/30/17 1942 07/01/17 0135 07/01/17 0645  TROPONINI 0.04* 0.03* 0.05*    Recent Labs Lab 06/30/17 1327  TROPIPOC 0.02     BNP Recent Labs Lab 06/30/17 1315  BNP 2,137.1*     DDimer No results for input(s): DDIMER in the last 168 hours.   Radiology    No results found.  Cardiac Studies   Cardiac cath 07/04/2017 Conclusion     Mid RCA lesion, 90 %stenosed.  Acute Mrg lesion, 90 %stenosed.  Ost LM to LM lesion, 45 %stenosed.  Ost LAD lesion, 70 %stenosed.  Ost LAD to Prox LAD lesion, 95 %stenosed.  1st Diag lesion, 90 %stenosed.  Ost 2nd Diag to 2nd Diag lesion, 90 %stenosed.  Prox LAD to Mid LAD lesion, 85 %stenosed.  Ost Cx lesion, 99 %stenosed.  Ost 1st Mrg lesion, 90 %stenosed.  LV end diastolic pressure is mildly  elevated.  Hemodynamic findings consistent with mild pulmonary hypertension.   1. Critical 3 vessel obstructive CAD.  2. Mild pulmonary HTN 3. Mildly elevated LV filling pressures  Plan: recommend CT surgery consult for CABG. Patient is not a candidate for percutaneous revascularization.    2D echo 07/02/2017 Study Conclusions  - Left ventricle: The cavity size was moderately dilated. Systolic   function was normal. The estimated ejection fraction was 15%.   Severe diffuse hypokinesis with regional variations. There was a   reduced contribution of atrial contraction to ventricular   filling, due to increased ventricular diastolic pressure or   atrial contractile dysfunction. Doppler parameters are consistent   with a reversible restrictive pattern, indicative of decreased   left ventricular diastolic compliance and/or increased left   atrial pressure (grade 3 diastolic dysfunction). Doppler   parameters are consistent with high ventricular filling pressure. - Mitral valve: Calcified annulus. Mild diffuse calcification of   the anterior leaflet. There was mild regurgitation. Valve area by   pressure half-time: 1.36 cm^2. - Left atrium: The atrium was mildly dilated. Anterior-posterior   dimension: 41 mm. - Pulmonary arteries: PA peak pressure: 37 mm Hg (S). - Recommendations: Images insufficient to rule out apical thrombus.   Recommend limited study with definity contrast.  Impressions:  - The right ventricular systolic pressure was increased  consistent   with mild pulmonary hypertension.   Patient Profile     76 y.o. male  with a previous history of CHF and poorly controlled diabetes mellitus who presented to ER with complaints of chest pain and shortness of breath. The patient was recently released by [facility] where he presented with chest pain and volume overload. Reportedly there is a difficulty in diuresis due to hypotension. After discharge he continued to  have ongoing chest pain and shortness of breath and presented to the emergency department at [facility]. 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 He also has dementia and is a poor historian. Previous ejection fraction is 25%. He also has a history of elevated liver function studies felt to be from venous congestion  Assessment & Plan    1.  Acute on chronic systolic CHF - he appears optimized on HF meds.   - he put out 1.2L yesterday and is net neg 8.4L.   - weight is down 10lbs  - Creatinine pending today.   - Continue carvedilol 3.125mg  BID.  BP too soft to add ARB/ACE I/aldactone at this time.   - Will start Lasix 20mg  PO daily.    2.  Severe multivessel CAD  - seen by CVTS and felt not to be a candidate for CABG due to comorbidities.  - continue ASA, statin and BB.   3.  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 - could not rule out apical thrombus on echo but I do not think he is a candidate for long term anticoagulation due to debilitate state and dementia.   - Palliative care consult has been placed.    No other recs at this time.  No other options for treatment and soft BP limits further addition of HF meds or antianginal meds.  Recommend following through with Palliative Care consult as life expectancy short.   Will sign off.    Signed, Armanda Magic, MD  07/07/2017, 8:39 AM

## 2017-07-07 NOTE — Consult Note (Signed)
Consultation Note Date: 07/07/2017   Patient Name: Devin Centerheodore Freeberg Jr.  DOB: [birth year]  MRN: [id number]  Age / Sex: 76 y.o., male  PCP: Shelbie AmmonsHaque, Imran P, MD Referring Physician: Lonia BloodMcClung, Jeffrey T, MD  Reason for Consultation: Establishing goals of care  HPI/Patient Profile: Devin Centerheodore Nicole Jr. is a 76 y.o. male with medical history significant of EF of 25%, decubitus, DM, transaminates who was recently at [facility] from 7/19-7/21.  [facility] by PCP for elevated LFTs.  There were thought to be elevated from venous congestion.  He had an echo there that showed EF 25%, global hypokinesis and grade 3 diastolic dsfn. A cardiac catheterization was completed at this facility, and the patient was found to have an EF of 15%, with recommendations for a CABG as he is not a candidate for perc revascularization.    Clinical Assessment and Goals of Care:  This NP Lorinda CreedMary Tell Rozelle along with Morton Stallrystal Griffin NP reviewed medical records, received report from team, assessed the patient and then meet at the patient's bedside along with family  to discuss diagnosis, prognosis, GOC, EOL wishes disposition and options.  A detailed discussion was had today regarding advanced directives.  Concepts specific to code status, artifical feeding and hydration, continued IV antibiotics and rehospitalization was had.  The difference between a aggressive medical intervention path  and a palliative comfort care path for this patient at this time was had.  Values and goals of care important to patient and family were attempted to be elicited.  Concept of Hospice and Palliative Care were discussed  Natural trajectory and expectations at EOL were discussed.  Questions and concerns addressed.   Family encouraged to call with questions or concerns.  PMT will continue to support holistically.   Patient is confused this morning  with dementia at baseline. He is complaining of shortness of breath and stomach pain.    He lives in a camper behind his niece's house. His family cooks for him.  His niece will be able to care for him in her home home after discharge.   Niece, Carney LivingDarla Allred [phone]  is POA for patient,with copy in chart. She is requesting "just make him comfortable" and are planning hospice at home if possible. She request patient to be DNR/DNI, which was confirmed at end of conversation. Will see patient tomorrow.      SUMMARY OF RECOMMENDATIONS    Code Status/Advance Care Planning:  DNR/DNI- comfort care. Niece wants patient to be made comfortable.    Symptom Management:   Morphine initiated for pain and shortness of breath.   Minimize medications to promote comfort.  Would not recommend re dosing Ambien as patient has dementia and per family is more confused today.   Palliative Prophylaxis:   Frequent Pain Assessment  Additional Recommendations (Limitations, Scope, Preferences):  Full Comfort Care and Minimize Medications  Psycho-social/Spiritual:   Per niece, patient is [employer] den[employer]. Speaking intermittently in tongues (prayer language).   Prognosis:   Unable to determine  Discharge Planning: To be determined. Consideration of home with palliative at this time. Will reassess patient status tomorrow.      Primary Diagnoses: Present on Admission: **None**   I have reviewed the medical record, interviewed the patient and family, and examined the patient. The following aspects are pertinent.  Past Medical History:  Diagnosis Date  . Charcot's joint of left ankle   . Diabetes mellitus without complication (HCC)   . Diabetic neuropathy (HCC)   . Nonpalpable pulse   . Peripheral angiopathy [facility])    Social History   Social History  . Marital status: Single    Spouse name: N/A  . Number of children: N/A  . Years of education: N/A   Social  History Main Topics  . Smoking status: Never Smoker  . Smokeless tobacco: Never Used  . Alcohol use No  . Drug use: No  . Sexual activity: Not Asked   Other Topics Concern  . None   Social History Narrative  . None   History reviewed. No pertinent family history. Scheduled Meds: . aspirin EC  81 mg Oral Daily  . atorvastatin  20 mg Oral q1800  . carvedilol  3.125 mg Oral BID WC  . enoxaparin (LOVENOX) injection  40 mg Subcutaneous Q24H  . hydrocerin   Topical BID  . insulin aspart  0-15 Units Subcutaneous Q4H  . insulin aspart  0-5 Units Subcutaneous QHS  . insulin glargine  15 Units Subcutaneous QHS  . pantoprazole  40 mg Oral Daily  . sodium chloride flush  3 mL Intravenous Q12H  . tamsulosin  0.4 mg Oral Daily   Continuous Infusions: . sodium chloride     PRN Meds:.sodium chloride, acetaminophen, diphenhydrAMINE, guaiFENesin, ipratropium-albuterol, ondansetron (ZOFRAN) IV, sodium chloride flush Medications Prior to Admission:  Prior to Admission medications   Medication Sig Start Date End Date Taking? Authorizing Provider  acetaminophen (TYLENOL) 325 MG tablet Take 650 mg by mouth every 6 (six) hours as needed for mild pain.    Yes [provider]  aspirin 81 MG tablet Take 81 mg by mouth daily.   Yes [provider]  carvedilol (COREG) 3.125 MG tablet Take 12.5 mg by mouth 2 (two) times daily with a meal.   Yes [provider]  furosemide (LASIX) 20 MG tablet Take 20 mg by mouth 2 (two) times daily.    Yes [provider]  glipiZIDE (GLUCOTROL) 5 MG tablet Take 5 mg by mouth daily before breakfast.   Yes [provider]  lisinopril (PRINIVIL,ZESTRIL) 2.5 MG tablet Take 2.5 mg by mouth daily.   Yes [provider]  omeprazole (PRILOSEC) 20 MG capsule Take 20 mg by mouth daily.   Yes [provider]  polyethylene glycol powder (MIRALAX) powder Take 17 g by mouth daily as needed for mild constipation.    Yes  [provider]  potassium chloride SA (K-DUR,KLOR-CON) 20 MEQ tablet Take 20 mEq by mouth daily.   Yes [provider]  spironolactone (ALDACTONE) 25 MG tablet Take 12.5 mg by mouth 2 (two) times daily.    Yes [provider]  traMADol (ULTRAM) 50 MG tablet Take 50 mg by mouth daily.   Yes [provider]   Allergies  Allergen Reactions  . Other     "TRANQUILIZERS" make him violent per Darla Allred POA "[facility]"  . Sulfa Antibiotics Other (See Comments)    Reaction unknown  Review of Systems  Constitutional: Positive for appetite change.       Family reporting patient has not eaten very well lately.   Respiratory: Positive for chest tightness and shortness of breath.   Neurological:       Pain  Psychiatric/Behavioral: Positive for sleep disturbance.       Confused    Physical Exam  HENT:  Head: Normocephalic and atraumatic.  Eyes: EOM are normal.  Cardiovascular:  Warm and dry  Pulmonary/Chest: Effort normal.  Abdominal: He exhibits no distension.  Musculoskeletal:  Bilateral boot splints to feet and ankle.   Neurological:  Confused. Dementia at baseline.     Vital Signs: BP 100/75 (BP Location: Right Arm)   Pulse 95   Temp 97.7 F (36.5 C) (Axillary)   Resp (!) 31   Ht 5\' 6"  (1.676 m)   Wt 84.6 kg (186 lb 8 oz)   SpO2 99%   BMI 30.10 kg/m  Pain Assessment: No/denies pain POSS *See Group Information*: 1-Acceptable,Awake and alert Pain Score: Asleep   SpO2: SpO2: 99 % O2 Device:SpO2: 99 % O2 Flow Rate: .O2 Flow Rate (L/min): 5 L/min  IO: Intake/output summary:  Intake/Output Summary (Last 24 hours) at 07/07/17 1026 Last data filed at 07/07/17 0855  Gross per 24 hour  Intake              240 ml  Output             1300 ml  Net            -1060 ml    LBM: Last BM Date: 07/05/17 Baseline Weight: Weight: 89 kg (196 lb 4.8 oz) Most recent weight: Weight: 84.6 kg (186  lb 8 oz)     Palliative Assessment/Data: 30%    Flowsheet Rows     Most Recent Value  Intake Tab  Referral Department  Hospitalist  Unit at Time of Referral  Cardiac/Telemetry Unit  Palliative Care Primary Diagnosis  Cardiac  Date Notified  07/06/17  Palliative Care Type  New Palliative care  Reason for referral  Clarify Goals of Care, End of Life Care Assistance  Date of Admission  06/30/17  # of days IP prior to Palliative referral  6  Clinical Assessment  Palliative Performance Scale Score  20%  Psychosocial & Spiritual Assessment  Palliative Care Outcomes     Discussed with Dr Sharon SellerMcClung  Time In: 9:15 Time Out: 10: 30  Time Total: 75 min  Greater than 50%  of this time was spent counseling and coordinating care related to the above assessment and plan.  Signed by: Orma FlamingGRIFFIN, CRYSTAL C, NP      Lorinda CreedMary Coalton Arch NP   Please contact Palliative Medicine Team phone at [phone] for questions and concerns.  For individual provider: See Loretha StaplerAmion

## 2017-07-07 NOTE — Progress Notes (Signed)
[facility] TEAM 1 - Stepdown/ICU TEAM  Devin Devin Merritt.  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  He was [facility] by his PCP for elevated LFTs, and [facility]   Subjective: The pt and family have decided to transition to comfort focused care.  There is no family present at the time of my exam.  The pt appears to be resting comfortably w/o evidence of uncontrolled pain or sob.    Assessment & Plan:  Acute exacerbation of chronic ischemic systolic and diastolic CHF with b/l pleural effusions EF as low as 15% via TTE this admit, w/ grade 3 DD - diuresis was being limited by hypotension as well as worsening renal failure c/w end-stage CHF   Severe multiple vessel CAD Cardiac cath noted numerous stenotic lesions of 90% or > - pt is not a candidate for CABG per TCTS  Hyponatremia Due to severe CHF   Acute kidney injury In setting of greatly diminished CO as well as attempts to diurese   Transam[facility] RUQ U/[facility] noted GB wall thickening of unknown significance but no ductal dilitation - 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 rate (!) 22, height 5'  6" (1.676 m), weight 84.6 kg (186 lb 8 oz), SpO2 100 %.  Intake/Output Summary (Last 24 hours) at 07/07/17 1506 Last data filed at 07/07/17 1245  Gross per 24 hour  Intake              480 ml  Output             1400 ml  Net             -920 ml   Filed Weights   07/05/17 0356 07/06/17 0353 07/07/17 0535  Weight: 89.2 kg (196 lb 11.9 oz) 89.2 kg (196 lb 10.4 oz) 84.6 kg (186 lb 8 oz)    Examination: No evidence of acute resp distress, or uncontrolled pain.  Pt lethargic but awakens to voice and can answer short direct questions.    CBC:  Recent Labs Lab 07/04/17 0508  WBC 5.6  HGB 10.3*  HCT 32.7*  MCV 86.1  PLT 204   Basic Metabolic Panel:  Recent Labs Lab 07/02/17 0247 07/03/17 0218 07/04/17 0343 07/04/17 1927 07/05/17 0257 07/06/17 0435 07/07/17 0242  NA 133* 131* 132*  --  133* 134*  --   K 3.7 3.8 3.4* 3.8 3.6 4.2  --   CL 98* 94* 95*  --  96* 97*  --   CO2 26 29 30   --  28 28  --   GLUCOSE 133* 194* 129*  --  108* 110*  --  BUN 29* 26* 24*  --  19 22*  --   CREATININE 1.50* 1.42* 1.18  --  1.11 1.22  --   CALCIUM 8.2* 8.0* 8.0*  --  7.9* 8.3*  --   MG 2.0 2.2 2.1 2.2 2.1 2.1 2.3   GFR: Estimated Creatinine Clearance: 53.4 mL/min (by C-G formula based on SCr of 1.22 mg/dL).  Liver Function Tests:  Recent Labs Lab 06/30/17 1942 07/02/17 0247  AST 20 20  ALT 119* 78*  ALKPHOS 93 78  BILITOT 1.2 0.7  PROT 6.2* 5.4*  ALBUMIN 2.9* 2.4*    Recent Results (from the past 240 hour(s))  Merritt culture (routine x 2)     Status: None   Collection Time: 06/30/17  3:39 PM  Result Value Ref Range Status   Specimen Description Merritt RIGHT HAND  Final   Special Requests   Final    BOTTLES DRAWN AEROBIC AND ANAEROBIC Merritt Culture adequate volume   Culture NO GROWTH 5 DAYS  Final   Report Status 07/05/2017 FINAL  Final  Culture, Merritt (Routine X 2) w Reflex to ID Panel     Status: None   Collection Time: 06/30/17  8:44 PM  Result Value Ref Range Status     Specimen Description Merritt LEFT ANTECUBITAL  Final   Special Requests   Final    BOTTLES DRAWN AEROBIC AND ANAEROBIC Merritt Culture adequate volume   Culture NO GROWTH 5 DAYS  Final   Report Status 07/05/2017 FINAL  Final  MRSA PCR Screening     Status: None   Collection Time: 07/06/17 11:46 PM  Result Value Ref Range Status   MRSA by PCR NEGATIVE NEGATIVE Final    Comment:        The GeneXpert MRSA Assay (FDA approved for NASAL specimens only), is one component of a comprehensive MRSA colonization surveillance program. It is not intended to diagnose MRSA infection nor to guide or monitor treatment for MRSA infections.      Scheduled Meds: . aspirin EC  81 mg Oral Daily  . carvedilol  3.125 mg Oral BID WC  . hydrocerin   Topical BID  . insulin aspart  0-15 Units Subcutaneous Q4H  . insulin aspart  0-5 Units Subcutaneous QHS  . insulin glargine  15 Units Subcutaneous QHS  . pantoprazole  40 mg Oral Daily  . sodium chloride flush  3 mL Intravenous Q12H  . tamsulosin  0.4 mg Oral Daily     LOS: 7 days   Devin BloodJeffrey T. Neenah Canter, MD [facility] Office  [phone] Pager - Text Page per Amion as per below:  On-Call/Text Page:      [email or web address]      password [email or web address]  If 7PM-7AM, please contact night-coverage [email or web address] Password [email or web address] 07/07/2017, 3:06 PM

## 2017-07-07 NOTE — Progress Notes (Signed)
Chaplain per consult visited with patient for prayer.  Patient has several nieces visiting with him.  Patient is happy to have support and is appreciative of prayer.  Chaplain prays for patient.  Chaplain available should this patient or family need further support.    07/07/17 1022  Clinical Encounter Type  Visited With Patient and family together  Visit Type Initial;Psychological support;Spiritual support;Social support  Referral From Physician  Consult/Referral To Chaplain  Spiritual Encounters  Spiritual Needs Prayer

## 2017-07-07 NOTE — Consult Note (Signed)
   [facility] CM Inpatient Consult   07/07/2017  Devin Centerheodore Woodburn Jr. 1941/04/11 [id number]    [facility] Care Management follow up.   [facility].   Chart reviewed. Disposition plans have changed. EMMI transition calls not appropriate at this time.   Spoke with inpatient LCSW. Patient likely to have hospice services at discharge.   Will update [facility] Care Management office.    Raiford NobleAtika Hall, MSN-Ed, RN,BSN [facility] Care Management Hospital Liaison [phone]

## 2017-07-07 NOTE — Plan of Care (Signed)
Problem: Safety: Goal: Ability to remain free from injury will improve Outcome: Progressing Fall risk bundle in place. No falls, skin break down or other injuries this shift. Bed alarm remains on, hourly rounding performed and pt turned q2h.   Problem: Pain Managment: Goal: General experience of comfort will improve Outcome: Progressing Pt showed physiological signs of pain 1X this shift. Tylenol given. Pt appeared to have some relief. Will continue to monitor.

## 2017-07-08 DIAGNOSIS — T17800S Unspecified foreign body in other parts of respiratory tract causing asphyxiation, sequela: Secondary | ICD-10-CM

## 2017-07-08 DIAGNOSIS — F0150 Vascular dementia without behavioral disturbance: Secondary | ICD-10-CM

## 2017-07-08 DIAGNOSIS — E1165 Type 2 diabetes mellitus with hyperglycemia: Secondary | ICD-10-CM

## 2017-07-08 DIAGNOSIS — E11610 Type 2 diabetes mellitus with diabetic neuropathic arthropathy: Secondary | ICD-10-CM

## 2017-07-08 DIAGNOSIS — J90 Pleural effusion, not elsewhere classified: Secondary | ICD-10-CM

## 2017-07-08 DIAGNOSIS — R0602 Shortness of breath: Secondary | ICD-10-CM

## 2017-07-08 DIAGNOSIS — I951 Orthostatic hypotension: Secondary | ICD-10-CM

## 2017-07-08 DIAGNOSIS — E876 Hypokalemia: Secondary | ICD-10-CM

## 2017-07-08 DIAGNOSIS — IMO0002 Reserved for concepts with insufficient information to code with codable children: Secondary | ICD-10-CM

## 2017-07-08 DIAGNOSIS — I5041 Acute combined systolic (congestive) and diastolic (congestive) heart failure: Secondary | ICD-10-CM

## 2017-07-08 DIAGNOSIS — T17800A Unspecified foreign body in other parts of respiratory tract causing asphyxiation, initial encounter: Secondary | ICD-10-CM

## 2017-07-08 MED ORDER — TAMSULOSIN HCL 0.4 MG PO CAPS: 0.4000 mg | ORAL_CAPSULE | Freq: Every day | ORAL | 0 refills | Status: AC

## 2017-07-08 MED ORDER — LORAZEPAM 2 MG/ML IJ SOLN: 0.5000 mg | Freq: Four times a day (QID) | INTRAMUSCULAR | 0 refills | Status: AC | PRN

## 2017-07-08 MED ORDER — ALBUTEROL SULFATE (2.5 MG/3ML) 0.083% IN NEBU: 2.5000 mg | INHALATION_SOLUTION | RESPIRATORY_TRACT | 0 refills | Status: AC | PRN

## 2017-07-08 MED ORDER — GUAIFENESIN 100 MG/5ML PO SOLN: 5.0000 mL | ORAL | 0 refills | Status: AC | PRN

## 2017-07-08 MED ORDER — MORPHINE SULFATE (CONCENTRATE) 10 MG/0.5ML PO SOLN: 5.0000 mg | ORAL | 0 refills | Status: AC | PRN

## 2017-07-08 MED ORDER — HYDROCERIN EX CREA: 1.0000 "application " | TOPICAL_CREAM | Freq: Two times a day (BID) | CUTANEOUS | 0 refills | Status: AC

## 2017-07-08 MED ORDER — ONDANSETRON HCL 4 MG/2ML IJ SOLN: 4.0000 mg | Freq: Four times a day (QID) | INTRAMUSCULAR | 0 refills | Status: AC | PRN

## 2017-07-08 NOTE — Progress Notes (Signed)
Patient will discharge to [facility]. Anticipated discharge date: 07/08/17 Family notified: Carney Livingarla Allred, niece  Transportation by: PTAR   CSW signing off.  Abigail ButtsSusan Manar Smalling, LCSWA  Clinical Social Worker

## 2017-07-08 NOTE — Discharge Summary (Signed)
Physician Discharge Summary  Devin Centerheodore Musolf Jr. NFA:[id number] DOB: [birth year] DOA: 06/30/2017  PCP: Shelbie AmmonsHaque, Imran P, MD  Admit date: 06/30/2017 Discharge date: 07/08/2017  Time spent: 35 minutes  Recommendations for Outpatient Follow-up: Acute systolic and diastolic CHF with b/l pleural effusions due to CHF -All cardiac medication discontinued when family decided on making patient full comfort care.  -Discharge weight Filed Weights   07/06/17 0353 07/07/17 0535 07/08/17 0420  Weight: 196 lb 10.4 oz (89.2 kg) 186 lb 8 oz (84.6 kg) 183 lb 1.6 oz (83.1 kg)  -was evaluated for CABG. found not to be an appropriate candidate, at which point it was decided to make patient full comfort care.  -CHF will be managed by inpatient Hospice. -Patient not expected to survive longer than 6 months.  Orthostatic hypotension -Secondary to deteriorating cardiac status.  -Full comfort care  Aspiration -7/28 patient passed swallow study: On dysphagia 2 diet with thin liquids.  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 -7/25 Hemoglobin A1c= 9.0 -Lipid panel within ADA guidelines -Changes DM medication and cholesterol medication to be managed by inpatient hospice  Dementia -Worsening as patients multiple medical problems worsen  Hypokalemia -Potassium goal>4  Multiple scrotal lesions -Barrier cream applied  Pain/Dyspnea  -To be managed by inpatient hospice     Discharge Diagnoses:  Active Problems:   CHF (congestive heart failure) (HCC)   Hyponatremia   Transaminitis   Acute systolic CHF (congestive heart failure) (HCC)   Diabetes (HCC)   Decubitus ulcer   Acute on chronic combined systolic and diastolic CHF (congestive heart failure) (HCC)   Failure to thrive in adult   DNR (do not resuscitate)   Palliative care by specialist   Shortness of breath   Discharge Condition: Guarded  Diet recommendation: Dysphagia to food consistency thin  Filed Weights   07/06/17 0353  07/07/17 0535 07/08/17 0420  Weight: 196 lb 10.4 oz (89.2 kg) 186 lb 8 oz (84.6 kg) 183 lb 1.6 oz (83.1 kg)    History of present illness:  76 y.o.WM PMHx Chronic Systolic and Diastolic CHF (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 from 7/19-7/21.   [facility]ed to [facility] sent by PCP for elevated LFTs. There were thought to be elevated from venous congestion. HE had an echo there that showed EF 25%, global hypokinesis and grade 3 diastolic dsfn. Family states patient is weight at physicians office~200 pounds. Family has notice increased swelling in his LE. Patient is a poor historian and unable to provide information other than his "butt hurts". Family states he has been unbale to lay flat and coughing more than usual.  During his hospitalization patient was evaluated for his increasing CP/SOB. Found to have acute systolic and diastolic CHF with bilateral pleural effusions. 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 Patient was evaluated by cardiothoracic surgery and secondary to his multiple medical conditions found not appropriate for CABG. Patient was stabilized with maximal medical management but continues to decline. Situation was discussed with patient and family and patient family decided on inpatient hospice secondary patient's continuing decline in health status.    Procedures: PCXR on 7/25: Worsening interstitial edema/ worsening left retrocardiac consolidation/atelectasis 7/27 Right/Left Heart cath:,-Mid RCA lesion, 90 %stenosed.-Acute Mrg lesion, 90 %stenosed.Suezanne Jacquet- -Ost LAD lesion, 70 %stenosed.Suezanne Jacquet-Ost LAD to Prox LAD lesion, 95 %stenosed. -1st Diag lesion, 90 %stenosed.Suezanne Jacquet-Ost 2nd Diag to 2nd Diag lesion, 90 %stenosed. -Prox LAD to  Mid LAD lesion, 85 %stenosed.-Ost Cx lesion, 99 %stenosed. -Ost 1st Mrg lesion, 90  %stenosed.    Consultations: Cardiology Cardiothoracic surgery   Antibiotics    Discharge Exam: Vitals:   07/07/17 1155 07/07/17 2031 07/08/17 0300 07/08/17 0420  BP: (!) 85/49 99/67  104/70  Pulse:   93 91  Resp: (!) 22 20  20   Temp: 97.8 F (36.6 C) 98.3 F (36.8 C)  98.2 F (36.8 C)  TempSrc: Oral Oral  Oral  SpO2: 100% 98% 94% 100%  Weight:    183 lb 1.6 oz (83.1 kg)  Height:        General: A/O 3, hallucinations, positive acute respiratory distress Cardiovascular: Regular rate, and rhythm without murmur gallop or rub normal S1 and S2 Respiratory: Clear to auscultation bilateral without wheezes or crackles  Discharge Instructions   Allergies as of 07/08/2017      Reactions   Other    "TRANQUILIZERS" make him violent per Darla Allred POA "[facility]"   Sulfa Antibiotics Other (See Comments)   Reaction unknown      Medication List    STOP taking these medications   carvedilol 3.125 MG tablet Commonly known as:  COREG   furosemide 20 MG tablet Commonly known as:  LASIX   glipiZIDE 5 MG tablet Commonly known as:  GLUCOTROL   lisinopril 2.5 MG tablet Commonly known as:  PRINIVIL,ZESTRIL   MIRALAX powder Generic drug:  polyethylene glycol powder   omeprazole 20 MG capsule Commonly known as:  PRILOSEC   potassium chloride SA 20 MEQ tablet Commonly known as:  K-DUR,KLOR-CON   spironolactone 25 MG tablet Commonly known as:  ALDACTONE   traMADol 50 MG tablet Commonly known as:  ULTRAM     TAKE these medications   acetaminophen 325 MG tablet Commonly known as:  TYLENOL Take 650 mg by mouth every 6 (six) hours as needed for mild pain.   albuterol (2.5 MG/3ML) 0.083% nebulizer solution Commonly known as:  PROVENTIL Take 3 mLs (2.5 mg total) by nebulization every 2 (two) hours as needed for wheezing.   aspirin 81 MG tablet Take 81 mg by mouth daily.   guaiFENesin 100 MG/5ML Soln Commonly  known as:  ROBITUSSIN Take 5 mLs (100 mg total) by mouth every 4 (four) hours as needed for cough or to loosen phlegm.   hydrocerin Crea Apply 1 application topically 2 (two) times daily.   LORazepam 2 MG/ML injection Commonly known as:  ATIVAN Inject 0.25-0.5 mLs (0.5-1 mg total) into the vein every 6 (six) hours as needed for anxiety.   morphine CONCENTRATE 10 MG/0.5ML Soln concentrated solution Take 0.25 mLs (5 mg total) by mouth every 2 (two) hours as needed for moderate pain or shortness of breath.   ondansetron 4 MG/2ML Soln injection Commonly known as:  ZOFRAN Inject 2 mLs (4 mg total) into the vein every 6 (six) hours as needed for nausea.   tamsulosin 0.4 MG Caps capsule Commonly known as:  FLOMAX Take 1 capsule (0.4 mg total) by mouth daily.      Allergies  Allergen Reactions  . Other     "TRANQUILIZERS" make him violent per Darla Allred POA "[facility]"  . Sulfa Antibiotics Other (See Comments)    Reaction unknown   Follow-up Information    Tereso Newcomer T, PA-C Follow up on 07/29/2017.   Specialties:  Cardiology, Physician  Assistant Why:  1:45 pm hospital follow up Contact information: [address] [place] [address] [phone]            The results of significant diagnostics from this hospitalization (including imaging, microbiology, ancillary and laboratory) are listed below for reference.    Significant Diagnostic Studies: Dg Chest 2 View  Result Date: 06/30/2017 CLINICAL DATA:  Weakness, cough and shortness of breath EXAM: CHEST  2 VIEW COMPARISON:  Report 07/04/2006 FINDINGS: Non inclusion of left CP angle. There are small bilateral pleural effusions with adjacent bibasilar atelectasis. Mild cardiomegaly. Aortic atherosclerosis. No pneumothorax. IMPRESSION: 1. Small bilateral pleural effusions with bibasilar atelectasis 2. Mild cardiomegaly Electronically Signed   By: Jasmine PangKim   Fujinaga M.D.   On: 06/30/2017 14:18   Dg Chest Port 1 View  Result Date: 07/05/2017 CLINICAL DATA:  Aspiration EXAM: PORTABLE CHEST 1 VIEW COMPARISON:  07/02/2017 FINDINGS: Cardiomegaly with vascular congestion. Right perihilar and left lower lobe airspace opacities. Suspect small bilateral effusions. No acute bony abnormality. IMPRESSION: Bilateral airspace opacities could reflect edema or infection. Suspect small effusions. Findings similar to prior study. Electronically Signed   By: Charlett NoseKevin  Dover M.D.   On: 07/05/2017 07:55   Dg Chest Port 1 View  Result Date: 07/02/2017 CLINICAL DATA:  Pleural effusion DM Peripheral angiopathy Diabetic neuropathy EXAM: PORTABLE CHEST - 1 VIEW COMPARISON:  06/30/2017 FINDINGS: Perihilar and bibasilar interstitial edema or infiltrates, slightly increased periods small pleural effusions left greater than right as before.Some increase in left retrocardiac consolidation/ atelectasis. Heart size upper limits normal for technique.  Atheromatous aorta. Visualized bones unremarkable.   No pneumothorax. IMPRESSION: 1. Worsening interstitial edema. 2. Small pleural effusions with worsening left retrocardiac consolidation/atelectasis. Electronically Signed   By: Corlis Leak  Hassell M.D.   On: 07/02/2017 08:30    Microbiology: Recent Results (from the past 240 hour(s))  Blood culture (routine x 2)     Status: None   Collection Time: 06/30/17  3:39 PM  Result Value Ref Range Status   Specimen Description BLOOD RIGHT HAND  Final   Special Requests   Final    BOTTLES DRAWN AEROBIC AND ANAEROBIC Blood Culture adequate volume   Culture NO GROWTH 5 DAYS  Final   Report Status 07/05/2017 FINAL  Final  Culture, blood (Routine X 2) w Reflex to ID Panel     Status: None   Collection Time: 06/30/17  8:44 PM  Result Value Ref Range Status   Specimen Description BLOOD LEFT ANTECUBITAL  Final   Special Requests   Final    BOTTLES DRAWN AEROBIC AND ANAEROBIC Blood Culture adequate volume    Culture NO GROWTH 5 DAYS  Final   Report Status 07/05/2017 FINAL  Final  MRSA PCR Screening     Status: None   Collection Time: 07/06/17 11:46 PM  Result Value Ref Range Status   MRSA by PCR NEGATIVE NEGATIVE Final    Comment:        The GeneXpert MRSA Assay (FDA approved for NASAL specimens only), is one component of a comprehensive MRSA colonization surveillance program. It is not intended to diagnose MRSA infection nor to guide or monitor treatment for MRSA infections.      Labs: Basic Metabolic Panel:  Recent Labs Lab 07/02/17 0247 07/03/17 0218 07/04/17 0343 07/04/17 1927 07/05/17 0257 07/06/17 0435 07/07/17 0242  NA 133* 131* 132*  --  133* 134*  --   K 3.7 3.8 3.4* 3.8 3.6 4.2  --   CL 98*  94* 95*  --  96* 97*  --   CO2 26 29 30   --  28 28  --   GLUCOSE 133* 194* 129*  --  108* 110*  --   BUN 29* 26* 24*  --  19 22*  --   CREATININE 1.50* 1.42* 1.18  --  1.11 1.22  --   CALCIUM 8.2* 8.0* 8.0*  --  7.9* 8.3*  --   MG 2.0 2.2 2.1 2.2 2.1 2.1 2.3   Liver Function Tests:  Recent Labs Lab 07/02/17 0247  AST 20  ALT 78*  ALKPHOS 78  BILITOT 0.7  PROT 5.4*  ALBUMIN 2.4*   No results for input(s): LIPASE, AMYLASE in the last 168 hours. No results for input(s): AMMONIA in the last 168 hours. CBC:  Recent Labs Lab 07/04/17 0508  WBC 5.6  HGB 10.3*  HCT 32.7*  MCV 86.1  PLT 204   Cardiac Enzymes: No results for input(s): CKTOTAL, CKMB, CKMBINDEX, TROPONINI in the last 168 hours. BNP: BNP (last 3 results)  Recent Labs  06/30/17 1315  BNP 2,137.1*    ProBNP (last 3 results) No results for input(s): PROBNP in the last 8760 hours.  CBG:  Recent Labs Lab 07/06/17 2007 07/07/17 0001 07/07/17 0542 07/07/17 0739 07/07/17 1128  GLUCAP 189* 170* 167* 138* 146*       Signed:  Carolyne Littles, MD [facility] [phone] pager

## 2017-07-08 NOTE — Care Management Note (Addendum)
Case Management Note  Patient Details  Name: Devin Centerheodore Buerger Jr. MRN: [id number] Date of Birth: [birth year]  Subjective/Objective:   Pt presented for CHF- Pt is now DNR- Palliative Care Consulted. Plan will be for Residential Hospice- Pt/ Family Agreeable. CSW assisting with disposition needs to [facility].                Action/Plan: CM was able to speak with family on 07-07-17 in regards to possible home with Hospice-Plan now for Residential. No further needs identified at this time by CM. Ambulance for transport to facility.   Expected Discharge Date:  07/08/17               Expected Discharge Plan:  Hospice Medical Facility  In-House Referral:  Clinical Social Work  Discharge planning Services  CM Consult  Post Acute Care Choice:  NA Choice offered to:  NA  DME Arranged:  N/A DME Agency:  NA  HH Arranged:  NA HH Agency:  NA  Status of Service:  Completed, signed off  If discussed at [employer] Length of Stay Meetings, dates discussed:  07-08-17  Additional Comments:  Devin Merritt, Dylynn Ketner Kaye, RN 07/08/2017, 12:24 PM

## 2017-07-08 NOTE — Plan of Care (Signed)
Problem: Nutrition: Goal: Adequate nutrition will be maintained Outcome: Not Progressing Patient with very little food intake, only willing to drink juice this morning. Will continue to offer patient food.

## 2017-07-08 NOTE — Progress Notes (Signed)
Daily Progress Note   Patient Name: Devin Merritt.       Date: 07/08/2017 DOB: 19-Apr-1941  Age: 76 y.o. MRN#: [id number] Attending Physician: Drema Dallas, MD Primary Care Physician: Shelbie Ammons, MD Admit Date: 06/30/2017  Reason for Consultation/Follow-up: Establishing goals of care, Hospice Evaluation and Psychosocial/spiritual support  Subjective: -continued conversation with HPOA regarding diagnosis and  prognosis, GOCs disposition and options  -Darla is comfortable with her decision for full comfort  -hope is for hospice facility  Length of Stay: 8  Current Medications: Scheduled Meds:  . hydrocerin   Topical BID  . tamsulosin  0.4 mg Oral Daily    Continuous Infusions:   PRN Meds: acetaminophen, albuterol, diphenhydrAMINE, guaiFENesin, LORazepam, morphine CONCENTRATE, ondansetron (ZOFRAN) IV  Physical Exam  Constitutional: He appears ill. Nasal cannula in place.  Cardiovascular: Normal rate, regular rhythm and normal heart sounds.   Abdominal:  -noted umbilical hernia, non tender  Neurological: He is alert.  Skin: Skin is warm and dry. There is pallor.            Vital Signs: BP 104/70 (BP Location: Right Arm)   Pulse 91   Temp 98.2 F (36.8 C) (Oral)   Resp 20   Ht 5\' 6"  (1.676 m)   Wt 83.1 kg (183 lb 1.6 oz)   SpO2 100%   BMI 29.55 kg/m  SpO2: SpO2: 100 % O2 Device: O2 Device: Nasal Cannula O2 Flow Rate: O2 Flow Rate (L/min): 5 L/min  Intake/output summary:  Intake/Output Summary (Last 24 hours) at 07/08/17 4098 Last data filed at 07/08/17 0423  Gross per 24 hour  Intake              290 ml  Output              350 ml  Net              -60 ml   LBM: Last BM Date: 07/05/17 Baseline Weight: Weight: 89 kg (196 lb 4.8 oz) Most recent  weight: Weight: 83.1 kg (183 lb 1.6 oz)       Palliative Assessment/Data: 20 %    Flowsheet Rows     Most Recent Value  Intake Tab  Referral Department  Hospitalist  Unit at Time of Referral  Cardiac/Telemetry Unit  Palliative Care  Primary Diagnosis  Cardiac  Date Notified  07/06/17  Palliative Care Type  New Palliative care  Reason for referral  Clarify Goals of Care, End of Life Care Assistance  Date of Admission  06/30/17  # of days IP prior to Palliative referral  6  Clinical Assessment  Palliative Performance Scale Score  20%  Psychosocial & Spiritual Assessment  Palliative Care Outcomes      Patient Active Problem List   Diagnosis Date Noted  . Failure to thrive in adult   . DNR (do not resuscitate)   . Palliative care by specialist   . Acute on chronic combined systolic and diastolic CHF (congestive heart failure) (HCC)   . CHF (congestive heart failure) (HCC) 06/30/2017  . Hyponatremia 06/30/2017  . Transaminitis 06/30/2017  . Acute systolic CHF (congestive heart failure) (HCC) 06/30/2017  . Diabetes (HCC) 06/30/2017  . Decubitus ulcer 06/30/2017    Palliative Care Assessment & Plan    Assessment:  - 76 yo male with significant CAD, not a surgical candidate.   Multiple co-morbid ites with continued physical, fucuntional and cognitive decline over the past year. - decision is for no further life prolonging measures at this known EOL trajectory   Recommendations/Plan:  Full comfort  Hospice facility  Goals of Care and Additional Recommendations:  Limitations on Scope of Treatment: Full Comfort Care  Code Status:    Code Status Orders        Start     Ordered   07/07/17 1024  Do not attempt resuscitation (DNR)  Continuous    Question Answer Comment  In the event of cardiac or respiratory ARREST Do not call a "code blue"   In the event of cardiac or respiratory ARREST Do not perform Intubation, CPR, defibrillation or ACLS   In the event of  cardiac or respiratory ARREST Use medication by any route, position, wound care, and other measures to relive pain and suffering. May use oxygen, suction and manual treatment of airway obstruction as needed for comfort.      07/07/17 1026    Code Status History    Date Active Date Inactive Code Status Order ID Comments User Context   06/30/2017  7:02 PM 07/07/2017 10:26 AM Full Code [id number]  Joseph ArtVann, Jessica U, DO Inpatient    Advance Directive Documentation     Most Recent Value  Type of Advance Directive  Healthcare Power of Attorney  Pre-existing out of facility DNR order (yellow form or pink MOST form)  -  "MOST" Form in Place?  -       Prognosis:   < 2 weeks-Patient continues to decline.  He is taking only sips of fluids no real solids for two days.  Cardia cath on 07-04-17 significant for non surgical candidate with CRITICAL 3 vessels obstructive CAD.   EF 15% unable to tolerate any escalation of medications for medical management.  Patient is requiring prn medications for symptom management   Focus of care is full comfort    Mid RCA lesion, 90 %stenosed.  Acute Mrg lesion, 90 %stenosed.  Ost LM to LM lesion, 45 %stenosed.  Ost LAD lesion, 70 %stenosed.  Ost LAD to Prox LAD lesion, 95 %stenosed.  1st Diag lesion, 90 %stenosed.  Ost 2nd Diag to 2nd Diag lesion, 90 %stenosed.  Prox LAD to Mid LAD lesion, 85 %stenosed.  Ost Cx lesion, 99 %stenosed.  Ost 1st Mrg lesion, 90 %stenosed.  LV end diastolic pressure is mildly elevated.  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   Discharge Planning:  Hospice facility  Care plan was discussed with Darla Allred/HPOA and Dr Joseph ArtWoods  Thank you for allowing the Palliative Medicine Team to assist in the care of this patient.   Time In: 0845 Time Out: 0920 Total Time 35 min Prolonged Time Billed  no       Greater than 50%  of this time was spent counseling and coordinating care related to the  above assessment and plan.  Lorinda CreedLARACH, Roselia Snipe, NP  Please contact Palliative Medicine Team phone at [phone] for questions and concerns.

## 2017-07-13 NOTE — Progress Notes (Deleted)
Cardiology Office Note Date:  07/13/2017  Patient ID:  Devin Centerheodore Kye Jr., DOB March 28, 1941, MRN [id number] PCP:  Shelbie AmmonsHaque, Imran P, MD  Cardiologist:  Dr. Tenny Crawoss  ***refresh   Chief Complaint: ***  History of Present Illness: Devin Centerheodore Ahmed Jr. is a 76 y.o. male with history of DM, CHF (systolic), CAD, LE wounds, dementia.  He comes in today to be seen for Dr. Graciela HusbandsKlein, last seen by him ***  He was most recently hospita;[facility] with CP/SOB, dx with severe triple vessel disease, evaluated bt CTS though not felt to be CABG candidate 2/2 co-morbid conditions and dementia, no PCI targets either, he was diuresed for fluid OL, BP limited ability to maximize meds.  Palliative care was recommended, and family decided DNR/DNI and ultimately comfort care/Hospice, all cardiac meds were discontinued.   *** family hx   Past Medical History:  Diagnosis Date  . Charcot's joint of left ankle   . Diabetes mellitus without complication (HCC)   . Diabetic neuropathy (HCC)   . Nonpalpable pulse   . Peripheral angiopathy (HCC)     Past Surgical History:  Procedure Laterality Date  . RIGHT/LEFT HEART CATH AND CORONARY ANGIOGRAPHY N/A 07/04/2017   Procedure: Right/Left Heart Cath and Coronary Angiography;  Surgeon: SwazilandJordan, Peter M, MD;  [facility] INVASIVE CV LAB;  Service: Cardiovascular;  Laterality: N/A;    Current Outpatient Prescriptions  Medication Sig Dispense Refill  . acetaminophen (TYLENOL) 325 MG tablet Take 650 mg by mouth every 6 (six) hours as needed for mild pain.     Marland Kitchen. albuterol (PROVENTIL) (2.5 MG/3ML) 0.083% nebulizer solution Take 3 mLs (2.5 mg total) by nebulization every 2 (two) hours as needed for wheezing. 75 mL 0  . aspirin 81 MG tablet Take 81 mg by mouth daily.    Marland Kitchen. guaiFENesin (ROBITUSSIN) 100 MG/5ML SOLN Take 5 mLs (100 mg total) by mouth every 4 (four) hours as needed for cough or to loosen phlegm. 1200 mL 0  . hydrocerin (EUCERIN) CREA Apply 1 application topically 2  (two) times daily. 454 g 0  . LORazepam (ATIVAN) 2 MG/ML injection Inject 0.25-0.5 mLs (0.5-1 mg total) into the vein every 6 (six) hours as needed for anxiety. 1 mL 0  . Morphine Sulfate (MORPHINE CONCENTRATE) 10 MG/0.5ML SOLN concentrated solution Take 0.25 mLs (5 mg total) by mouth every 2 (two) hours as needed for moderate pain or shortness of breath. 15 mL 0  . ondansetron (ZOFRAN) 4 MG/2ML SOLN injection Inject 2 mLs (4 mg total) into the vein every 6 (six) hours as needed for nausea. 2 mL 0  . tamsulosin (FLOMAX) 0.4 MG CAPS capsule Take 1 capsule (0.4 mg total) by mouth daily. 30 capsule 0   No current facility-administered medications for this visit.     Allergies:   Other and Sulfa antibiotics   Social History:  The patient  reports that he has never smoked. He has never used smokeless tobacco. He reports that he does not drink alcohol or use drugs.   Family History:  The patient's family history is not on file.***  ROS:  Please see the history of present illness.  All other systems are reviewed and otherwise negative.   PHYSICAL EXAM: *** VS:  There were no vitals taken for this visit. BMI: There is no height or weight on file to calculate BMI. Well nourished, well developed, in no acute distress  HEENT: normocephalic, atraumatic  Neck: no JVD, carotid bruits or masses Cardiac: *** RRR;  no significant murmurs, no rubs, or gallops Lungs:  *** CTA b/l, no wheezing, rhonchi or rales  Abd: soft, nontender MS: no deformity or *** atrophy Ext: *** no edema  Skin: warm and dry, no rash Neuro:  No gross deficits appreciated Psych: euthymic mood, full affect    EKG:  Done today shows ***   Cardiac cath 07/04/2017 Conclusion    Mid RCA lesion, 90 %stenosed.  Acute Mrg lesion, 90 %stenosed.  Ost LM to LM lesion, 45 %stenosed.  Ost LAD lesion, 70 %stenosed.  Ost LAD to Prox LAD lesion, 95 %stenosed.  1st Diag lesion, 90 %stenosed.  Ost 2nd Diag to 2nd Diag lesion,  90 %stenosed.  Prox LAD to Mid LAD lesion, 85 %stenosed.  Ost Cx lesion, 99 %stenosed.  Ost 1st Mrg lesion, 90 %stenosed.  LV end diastolic pressure is mildly elevated.  Hemodynamic findings consistent with mild pulmonary hypertension.  1. Critical 3 vessel obstructive CAD.  2. Mild pulmonary HTN 3. Mildly elevated LV filling pressures  Plan: recommend CT surgery consult for CABG. Patient is not a candidate for percutaneous revascularization.    2D echo 07/02/2017 Study Conclusions - Left ventricle: The cavity size was moderately dilated. Systolic function was normal. The estimated ejection fraction was 15%. Severe diffuse hypokinesis with regional variations. There was a reduced contribution of atrial contraction to ventricular filling, due to increased ventricular diastolic pressure or atrial contractile dysfunction. Doppler parameters are consistent with a reversible restrictive pattern, indicative of decreased left ventricular diastolic compliance and/or increased left atrial pressure (grade 3 diastolic dysfunction). Doppler parameters are consistent with high ventricular filling pressure. - Mitral valve: Calcified annulus. Mild diffuse calcification of the anterior leaflet. There was mild regurgitation. Valve area by pressure half-time: 1.36 cm^2. - Left atrium: The atrium was mildly dilated. Anterior-posterior dimension: 41 mm. - Pulmonary arteries: PA peak pressure: 37 mm Hg (S). - Recommendations: Images insufficient to rule out apical thrombus. Recommend limited study with definity contrast. Impressions: - The right ventricular systolic pressure was increased consistent with mild pulmonary hypertension.    Recent Labs: 06/30/2017: B Natriuretic Peptide 2,137.1 07/02/2017: ALT 78 07/04/2017: Hemoglobin 10.3; Platelets 204 07/06/2017: BUN 22; Creatinine, Ser 1.22; Potassium 4.2; Sodium 134 07/07/2017: Magnesium 2.3  07/02/2017:  Cholesterol 100; HDL 21; LDL Cholesterol 68; Total CHOL/HDL Ratio 4.8; Triglycerides 57; VLDL 11   Estimated Creatinine Clearance: 52.9 mL/min (by C-G formula based on SCr of 1.22 mg/dL).   Wt Readings from Last 3 Encounters:  07/08/17 183 lb 1.6 oz (83.1 kg)     Other studies reviewed: Additional studies/records reviewed today include: summarized above  ASSESSMENT AND PLAN:   Disposition: F/u with ***  Current medicines are reviewed at length with the patient today.  The patient did not have any concerns regarding medicines.***  Signed, Sherrilee GillesRenee Ursy, PA-C 07/13/2017 9:22 AM     [facility] [address] [place] [address] [phone]) [phone] (office)  [phone] (fax)

## 2017-07-14 ENCOUNTER — Ambulatory Visit: Payer: PPO | Admitting: Physician Assistant

## 2017-07-15 ENCOUNTER — Encounter: Payer: Self-pay | Admitting: Physician Assistant

## 2017-07-29 ENCOUNTER — Ambulatory Visit: Payer: PPO | Admitting: Physician Assistant

## 2017-07-29 DIAGNOSIS — I2510 Atherosclerotic heart disease of native coronary artery without angina pectoris: Secondary | ICD-10-CM | POA: Insufficient documentation

## 2017-07-29 NOTE — Progress Notes (Deleted)
Cardiology Office Note:    Date:  07/29/2017   ID:  Devin Olden., DOB [birth year], MRN [id number]  PCP:  Raelyn Number, MD  Cardiologist:  Marland Kitchen  Referring MD: Raelyn Number, MD   No chief complaint on file. ***  History of Present Illness:    Devin Aydelott. is a 76 y.o. male with a hx of CAD, systolic HF, diabetes, dementia, sacral decubiti. He had recently been admitted to [facility] with chest pain and acute on chronic systolic heart failure. After discharge, he continued to have chest pain and shortness of breath and was admitted to [facility] 7/23-7/31 for acute on chronic systolic and diastolic heart failure. He underwent cardiac catheterization which demonstrated severe 3 vessel CAD. There were no options for PCI. He was then seen by cardiac surgery (Dr. Prescott Gum). He was not felt to be a candidate for CABG given his multiple comorbid illnesses. Specifically the patient has dementia, sacral decubiti, non-ambulatory status and inability to care for himself. Palliative care was recommended. He was made DO NOT RESUSCITATE/DO NOT INTUBATE. It appears that the patient was to be set up for hospice.  Devin Merritt ***  Prior CV studies:   The following studies were reviewed today:  R/L Cardiac Catheterization 07/05/15 LM 45 LAD ostial 70/95, proximal 85; D2 ostial 90 LCx ostial 99, OM1 90 RCA mid 90, AM branch 90 PASP 48, mean RA 10, mean PCWP 24, LVEDP 0  Echocardiogram 07/02/17 EF 15, diffuse HK, grade 3 diastolic dysfunction, MAC, mild MR, mild LAE, PASP 37  Past Medical History:  Diagnosis Date  . Charcot's joint of left ankle   . Diabetes mellitus without complication ([place])   . Diabetic neuropathy ([place])   . Nonpalpable pulse   . Peripheral angiopathy ([place])     Past Surgical History:  Procedure Laterality Date  . RIGHT/LEFT HEART CATH AND CORONARY ANGIOGRAPHY N/A 07/04/2017   Procedure: Right/Left Heart Cath and Coronary Angiography;  Surgeon:  Martinique, Peter M, MD;  Location: [facility] CV LAB;  Service: Cardiovascular;  Laterality: N/A;    Current Medications: No outpatient prescriptions have been marked as taking for the 07/29/17 encounter (Appointment) with Richardson Dopp T, PA-C.     Allergies:   Other and Sulfa antibiotics   Social History   Social History  . Marital status: Single    Spouse name: N/A  . Number of children: N/A  . Years of education: N/A   Social History Main Topics  . Smoking status: Never Smoker  . Smokeless tobacco: Never Used  . Alcohol use No  . Drug use: No  . Sexual activity: Not on file   Other Topics Concern  . Not on file   Social History Narrative  . No narrative on file     Family Hx: The patient's family history is not on file.  ROS:   Please see the history of present illness.    ROS All other systems reviewed and are negative.   EKGs/Labs/Other Test Reviewed:    EKG:  EKG is *** ordered today.  The ekg ordered today demonstrates ***  Recent Labs: 06/30/2017: B Natriuretic Peptide 2,137.1 07/02/2017: ALT 78 07/04/2017: Hemoglobin 10.3; Platelets 204 07/06/2017: BUN 22; Creatinine, Ser 1.22; Potassium 4.2; Sodium 134 07/07/2017: Magnesium 2.3   Recent Lipid Panel Lab Results  Component Value Date/Time   CHOL 100 07/02/2017 02:47 AM   TRIG 57 07/02/2017 02:47 AM   HDL 21 (L) 07/02/2017 02:47  AM   CHOLHDL 4.8 07/02/2017 02:47 AM   LDLCALC 68 07/02/2017 02:47 AM    Physical Exam:    VS:  There were no vitals taken for this visit.    Wt Readings from Last 3 Encounters:  07/08/17 183 lb 1.6 oz (83.1 kg)     ***Physical Exam  ASSESSMENT:    1. Chronic combined systolic and diastolic heart failure ([place])   2. Coronary artery disease involving native coronary artery of native heart with angina pectoris ([place])   3. Type 2 diabetes, uncontrolled, with Charcot's joint of foot ([place])   4. DNR (do not resuscitate)    PLAN:    In order of problems listed  above:  Chronic combined systolic and diastolic heart failure ([place])  Coronary artery disease involving native coronary artery of native heart with angina pectoris ([place])  Type 2 diabetes, uncontrolled, with Charcot's joint of foot ([place])  DNR (do not resuscitate)***  Dispo:  No Follow-up on file.   Medication Adjustments/Labs and Tests Ordered: Current medicines are reviewed at length with the patient today.  Concerns regarding medicines are outlined above.  Tests Ordered: No orders of the defined types were placed in this encounter.  Medication Changes: No orders of the defined types were placed in this encounter.   Signed, Richardson Dopp, PA-C  07/29/2017 1:44 PM    [facility] [place], [place], [place]  [address] Phone: [phone]; Fax: [phone]

## 2017-08-09 DEATH — deceased

## 2017-08-25 ENCOUNTER — Encounter: Payer: Self-pay | Admitting: Physician Assistant

## 2017-08-26 ENCOUNTER — Encounter: Payer: Self-pay | Admitting: Physician Assistant

## 2018-11-27 IMAGING — DX DG CHEST 1V PORT
1 series · 1 of 1 positions shown · non-contrast
Comparison: 06/30/2017

CLINICAL DATA: Pleural effusion DM Peripheral angiopathy Diabetic
neuropathy

EXAM:
PORTABLE CHEST - 1 VIEW

[chest ap]
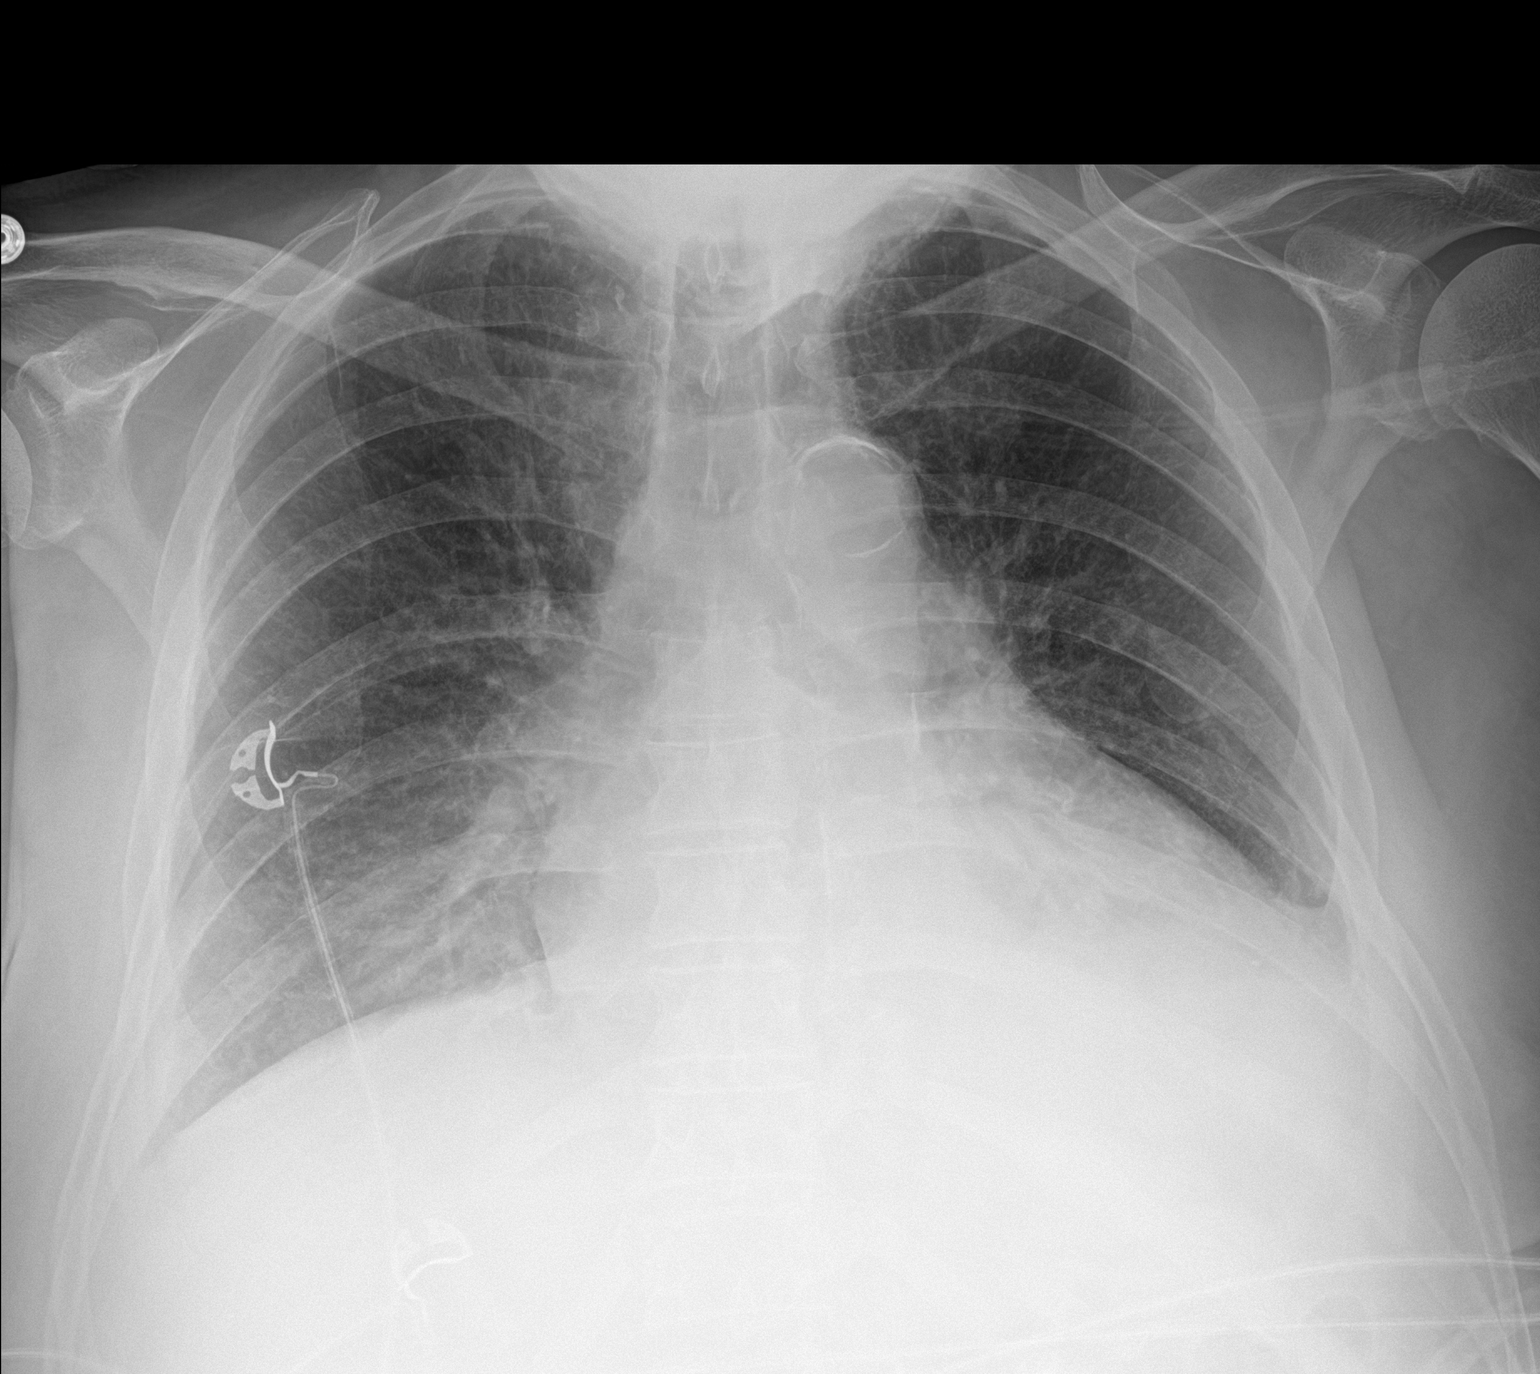

[1 of 1 positions shown; findings below may reference images not displayed]

FINDINGS: Perihilar and bibasilar interstitial edema or infiltrates, slightly
increased periods small pleural effusions left greater than right as
before.Some increase in left retrocardiac consolidation/
atelectasis.

Heart size upper limits normal for technique.  Atheromatous aorta.

Visualized bones unremarkable.   No pneumothorax.
IMPRESSION: 1. Worsening interstitial edema.
2. Small pleural effusions with worsening left retrocardiac
consolidation/atelectasis.
